# Patient Record
Sex: Female | Born: 1970 | Race: White | Hispanic: No | Marital: Married | Smoking: Never smoker
Health system: Southern US, Community
[De-identification: ages and names within clinical notes are randomized; demographics above are authoritative.]

## PROBLEM LIST (undated history)

## (undated) DIAGNOSIS — R011 Cardiac murmur, unspecified: Secondary | ICD-10-CM

## (undated) DIAGNOSIS — I10 Essential (primary) hypertension: Secondary | ICD-10-CM

## (undated) DIAGNOSIS — J45909 Unspecified asthma, uncomplicated: Secondary | ICD-10-CM

## (undated) HISTORY — PX: TUBAL LIGATION: SHX77

---

## 2012-02-12 ENCOUNTER — Ambulatory Visit: Payer: Self-pay | Admitting: Internal Medicine

## 2013-12-08 ENCOUNTER — Ambulatory Visit: Payer: Self-pay | Admitting: Nurse Practitioner

## 2015-03-23 ENCOUNTER — Other Ambulatory Visit: Payer: Self-pay

## 2015-03-23 DIAGNOSIS — E559 Vitamin D deficiency, unspecified: Secondary | ICD-10-CM

## 2015-03-23 DIAGNOSIS — E785 Hyperlipidemia, unspecified: Secondary | ICD-10-CM

## 2015-03-23 DIAGNOSIS — R5382 Chronic fatigue, unspecified: Secondary | ICD-10-CM

## 2015-03-23 DIAGNOSIS — I10 Essential (primary) hypertension: Secondary | ICD-10-CM

## 2015-03-23 NOTE — Progress Notes (Signed)
Patient came in to have blood drawn per Imelda Pillowhelsa Holland, NP-C at [facility].  Blood was drawn from the right arm without any incident. [facility].

## 2015-03-24 LAB — CMP12+LP+TP+TSH+6AC+CBC/D/PLT
A/G RATIO: 1.5 (ref 1.1–2.5)
ALBUMIN: 3.9 g/dL (ref 3.5–5.5)
ALT: 12 IU/L (ref 0–32)
AST: 14 IU/L (ref 0–40)
Alkaline Phosphatase: 60 IU/L (ref 39–117)
BASOS ABS: 0 10*3/uL (ref 0.0–0.2)
BILIRUBIN TOTAL: 0.4 mg/dL (ref 0.0–1.2)
BUN/Creatinine Ratio: 16 (ref 9–23)
BUN: 13 mg/dL (ref 6–24)
Basos: 0 %
CHOLESTEROL TOTAL: 197 mg/dL (ref 100–199)
CREATININE: 0.8 mg/dL (ref 0.57–1.00)
Calcium: 8.8 mg/dL (ref 8.7–10.2)
Chloride: 103 mmol/L (ref 97–106)
Chol/HDL Ratio: 6.2 ratio units — ABNORMAL HIGH (ref 0.0–4.4)
EOS (ABSOLUTE): 0.3 10*3/uL (ref 0.0–0.4)
Eos: 5 %
Estimated CHD Risk: 1.7 times avg. — ABNORMAL HIGH (ref 0.0–1.0)
FREE THYROXINE INDEX: 2 (ref 1.2–4.9)
GFR, EST AFRICAN AMERICAN: 103 mL/min/{1.73_m2} (ref >59)
GFR, EST NON AFRICAN AMERICAN: 89 mL/min/{1.73_m2} (ref >59)
GGT: 19 IU/L (ref 0–60)
GLOBULIN, TOTAL: 2.6 g/dL (ref 1.5–4.5)
Glucose: 102 mg/dL — ABNORMAL HIGH (ref 65–99)
HDL: 32 mg/dL — AB (ref >39)
Hematocrit: 41.1 % (ref 34.0–46.6)
Hemoglobin: 14 g/dL (ref 11.1–15.9)
IRON: 70 ug/dL (ref 27–159)
Immature Grans (Abs): 0 10*3/uL (ref 0.0–0.1)
Immature Granulocytes: 0 %
LDH: 197 IU/L (ref 119–226)
LDL Calculated: 124 mg/dL — ABNORMAL HIGH (ref 0–99)
LYMPHS ABS: 2.1 10*3/uL (ref 0.7–3.1)
Lymphs: 30 %
MCH: 29.5 pg (ref 26.6–33.0)
MCHC: 34.1 g/dL (ref 31.5–35.7)
MCV: 87 fL (ref 79–97)
MONOCYTES: 8 %
Monocytes Absolute: 0.6 10*3/uL (ref 0.1–0.9)
NEUTROS PCT: 57 %
Neutrophils Absolute: 3.8 10*3/uL (ref 1.4–7.0)
PHOSPHORUS: 3.1 mg/dL (ref 2.5–4.5)
PLATELETS: 357 10*3/uL (ref 150–379)
Potassium: 4 mmol/L (ref 3.5–5.2)
RBC: 4.74 x10E6/uL (ref 3.77–5.28)
RDW: 13.2 % (ref 12.3–15.4)
Sodium: 142 mmol/L (ref 136–144)
T3 UPTAKE RATIO: 29 % (ref 24–39)
T4 TOTAL: 6.8 ug/dL (ref 4.5–12.0)
TOTAL PROTEIN: 6.5 g/dL (ref 6.0–8.5)
TRIGLYCERIDES: 204 mg/dL — AB (ref 0–149)
TSH: 1.73 u[IU]/mL (ref 0.450–4.500)
URIC ACID: 5.3 mg/dL (ref 2.5–7.1)
VLDL Cholesterol Cal: 41 mg/dL — ABNORMAL HIGH (ref 5–40)
WBC: 6.8 10*3/uL (ref 3.4–10.8)

## 2015-03-24 LAB — VITAMIN D 25 HYDROXY (VIT D DEFICIENCY, FRACTURES): VIT D 25 HYDROXY: 15 ng/mL — AB (ref 30.0–100.0)

## 2015-04-17 ENCOUNTER — Encounter: Payer: Self-pay | Admitting: Physician Assistant

## 2015-04-17 ENCOUNTER — Ambulatory Visit: Payer: Self-pay | Admitting: Physician Assistant

## 2015-04-17 VITALS — BP 160/110 | HR 79 | Temp 98.4°F

## 2015-04-17 DIAGNOSIS — J018 Other acute sinusitis: Secondary | ICD-10-CM

## 2015-04-17 MED ORDER — FLUTICASONE PROPIONATE 50 MCG/ACT NA SUSP: 2.0000 | Freq: Every day | NASAL | Status: DC

## 2015-04-17 MED ORDER — AZITHROMYCIN 250 MG PO TABS: ORAL_TABLET | ORAL | Status: DC

## 2015-04-17 NOTE — Progress Notes (Signed)
S: C/o runny nose and congestion for 1.5 weeks, no fever, chills, cp/sob, v/d; mucus is green and thick, cough is sporadic, c/o of facial and dental pain.  Voice is hoarse, used otc sinus med that has a decongestant in it, hasn't been taking her bp meds as it makes her feel bad  O: PE: vitals wnl excpet bp elevated at 160/100,  nad,  perrl eomi, normocephalic, tms dull, nasal mucosa red and swollen, throat injected, neck supple no lymph, lungs c t a, cv rrr, neuro intact  A:  Acute sinusitis, htn, noncompliant with meds  P: zpack, flonase, stop using otc meds, drink fluids, continue regular meds , return if not improving in 5 days, return earlier if worsening , see pcp tomorrow for already scheduled appointment for bp issues

## 2015-07-02 ENCOUNTER — Other Ambulatory Visit: Payer: Self-pay

## 2015-07-02 DIAGNOSIS — Z299 Encounter for prophylactic measures, unspecified: Secondary | ICD-10-CM

## 2015-07-02 NOTE — Progress Notes (Signed)
Patient came in to have blood drawn per Dr. Christy SartoriusShaukat Khan's orders.  Blood was drawn from the left arm without any incident.

## 2015-07-04 LAB — COMPREHENSIVE METABOLIC PANEL
A/G RATIO: 1.8 (ref 1.2–2.2)
ALBUMIN: 4.1 g/dL (ref 3.5–5.5)
ALT: 20 IU/L (ref 0–32)
AST: 14 IU/L (ref 0–40)
Alkaline Phosphatase: 53 IU/L (ref 39–117)
BUN / CREAT RATIO: 19 (ref 9–23)
BUN: 16 mg/dL (ref 6–24)
Bilirubin Total: 0.4 mg/dL (ref 0.0–1.2)
CALCIUM: 9.1 mg/dL (ref 8.7–10.2)
CO2: 20 mmol/L (ref 18–29)
Chloride: 102 mmol/L (ref 96–106)
Creatinine, Ser: 0.84 mg/dL (ref 0.57–1.00)
GFR, EST AFRICAN AMERICAN: 97 mL/min/{1.73_m2} (ref >59)
GFR, EST NON AFRICAN AMERICAN: 84 mL/min/{1.73_m2} (ref >59)
GLOBULIN, TOTAL: 2.3 g/dL (ref 1.5–4.5)
Glucose: 107 mg/dL — ABNORMAL HIGH (ref 65–99)
POTASSIUM: 4.1 mmol/L (ref 3.5–5.2)
Sodium: 143 mmol/L (ref 134–144)
TOTAL PROTEIN: 6.4 g/dL (ref 6.0–8.5)

## 2015-07-04 LAB — CK ISOENZYMES
CK MB: 0 % (ref 0–3)
CK-BB: 0 %
CK-MM: 100 % (ref 97–100)
MACRO TYPE 2: 0 %
Macro Type 1: 0 %
Total CK: 101 U/L (ref 24–173)

## 2015-07-04 LAB — SPECIMEN STATUS

## 2015-07-04 LAB — MAGNESIUM: Magnesium: 2 mg/dL (ref 1.6–2.3)

## 2015-07-06 ENCOUNTER — Encounter: Payer: Self-pay | Admitting: Emergency Medicine

## 2015-07-06 NOTE — Progress Notes (Signed)
Lab results were faxed to Dr. Adrian BlackwaterShaukat Khan at [facility] per patient's request.

## 2015-07-07 ENCOUNTER — Encounter: Payer: Self-pay | Admitting: Emergency Medicine

## 2015-07-07 ENCOUNTER — Emergency Department: Payer: Managed Care, Other (non HMO)

## 2015-07-07 ENCOUNTER — Emergency Department
Admission: EM | Admit: 2015-07-07 | Discharge: 2015-07-07 | Disposition: A | Discharge status: Home / Self Care | Payer: Managed Care, Other (non HMO) | Attending: Emergency Medicine | Admitting: Emergency Medicine

## 2015-07-07 DIAGNOSIS — Y999 Unspecified external cause status: Secondary | ICD-10-CM | POA: Insufficient documentation

## 2015-07-07 DIAGNOSIS — S022XXA Fracture of nasal bones, initial encounter for closed fracture: Secondary | ICD-10-CM | POA: Diagnosis not present

## 2015-07-07 DIAGNOSIS — Y9389 Activity, other specified: Secondary | ICD-10-CM | POA: Insufficient documentation

## 2015-07-07 DIAGNOSIS — J45909 Unspecified asthma, uncomplicated: Secondary | ICD-10-CM | POA: Insufficient documentation

## 2015-07-07 DIAGNOSIS — S60041A Contusion of right ring finger without damage to nail, initial encounter: Secondary | ICD-10-CM | POA: Diagnosis not present

## 2015-07-07 DIAGNOSIS — Y929 Unspecified place or not applicable: Secondary | ICD-10-CM | POA: Insufficient documentation

## 2015-07-07 DIAGNOSIS — S6000XA Contusion of unspecified finger without damage to nail, initial encounter: Secondary | ICD-10-CM

## 2015-07-07 DIAGNOSIS — I10 Essential (primary) hypertension: Secondary | ICD-10-CM | POA: Insufficient documentation

## 2015-07-07 DIAGNOSIS — W138XXA Fall from, out of or through other building or structure, initial encounter: Secondary | ICD-10-CM | POA: Insufficient documentation

## 2015-07-07 DIAGNOSIS — Z79899 Other long term (current) drug therapy: Secondary | ICD-10-CM | POA: Diagnosis not present

## 2015-07-07 DIAGNOSIS — S60221A Contusion of right hand, initial encounter: Secondary | ICD-10-CM | POA: Insufficient documentation

## 2015-07-07 DIAGNOSIS — S0033XA Contusion of nose, initial encounter: Secondary | ICD-10-CM

## 2015-07-07 DIAGNOSIS — S0990XA Unspecified injury of head, initial encounter: Secondary | ICD-10-CM

## 2015-07-07 DIAGNOSIS — S60021A Contusion of right index finger without damage to nail, initial encounter: Secondary | ICD-10-CM | POA: Diagnosis not present

## 2015-07-07 DIAGNOSIS — S60031A Contusion of right middle finger without damage to nail, initial encounter: Secondary | ICD-10-CM | POA: Diagnosis not present

## 2015-07-07 HISTORY — DX: Unspecified asthma, uncomplicated: J45.909

## 2015-07-07 HISTORY — DX: Essential (primary) hypertension: I10

## 2015-07-07 MED ORDER — HYDROCODONE-ACETAMINOPHEN 5-325 MG PO TABS: 1.0000 | ORAL_TABLET | Freq: Four times a day (QID) | ORAL | Status: DC | PRN

## 2015-07-07 NOTE — ED Notes (Signed)
Was walking down steps, step broke, caused pt to fall, landing on her face yesterday. Pt states her nose bled from both nares. Today, bilateral eye bruising. Also c/o right wrist and finger pain as well.

## 2015-07-07 NOTE — Discharge Instructions (Signed)
Facial or Scalp Contusion  A facial or scalp contusion is a deep bruise on the face or head. Contusions happen when an injury causes bleeding under the skin. Signs of bruising include pain, puffiness (swelling), and discolored skin. The contusion may turn blue, purple, or yellow. HOME CARE  Only take medicines as told by your doctor.  Put ice on the injured area.  Put ice in a plastic bag.  Place a towel between your skin and the bag.  Leave the ice on for 20 minutes, 2-3 times a day. GET HELP IF:  You have bite problems.  You have pain when chewing.  You are worried about your face not healing normally. GET HELP RIGHT AWAY IF:   You have severe pain or a headache and medicine does not help.  You are very tired or confused, or your personality changes.  You throw up (vomit).  You have a nosebleed that will not stop.  You see two of everything (double vision) or have blurry vision.  You have fluid coming from your nose or ear.  You have problems walking or using your arms or legs. MAKE SURE YOU:   Understand these instructions.  Will watch your condition.  Will get help right away if you are not doing well or get worse.   This information is not intended to replace advice given to you by your health care provider. Make sure you discuss any questions you have with your health care provider.   Document Released: 03/20/2011 Document Revised: 04/21/2014 Document Reviewed: 11/11/2012 Elsevier Interactive Patient Education 2016 Elsevier Inc[employer]  Cryotherapy Cryotherapy is when you put ice on your injury. Ice helps lessen pain and puffiness (swelling) after an injury. Ice works the best when you start using it in the first 24 to 48 hours after an injury. HOME CARE  Put a dry or damp towel between the ice pack and your skin.  You may press gently on the ice pack.  Leave the ice on for no more than 10 to 20 minutes at a time.  Check your skin after 5 minutes to make sure  your skin is okay.  Rest at least 20 minutes between ice pack uses.  Stop using ice when your skin loses feeling (numbness).  Do not use ice on someone who cannot tell you when it hurts. This includes small children and people with memory problems (dementia). GET HELP RIGHT AWAY IF:  You have white spots on your skin.  Your skin turns blue or pale.  Your skin feels waxy or hard.  Your puffiness gets worse. MAKE SURE YOU:   Understand these instructions.  Will watch your condition.  Will get help right away if you are not doing well or get worse.   This information is not intended to replace advice given to you by your health care provider. Make sure you discuss any questions you have with your health care provider.   Document Released: 09/17/2007 Document Revised: 06/23/2011 Document Reviewed: 11/21/2010 Elsevier Interactive Patient Education 2016 Elsevier Inc.  Nasal Fracture A nasal fracture is a break or crack in the bones or cartilage of the nose. Minor breaks do not require treatment. These breaks usually heal on their own after about one month. Serious breaks may require surgery. CAUSES This injury is usually caused by a blunt injury to the nose. This type of injury often occurs from:  Contact sports.  Car accidents.  Falls.  Getting punched. SYMPTOMS Symptoms of this injury include:  Pain.  Swelling of the nose.  Bleeding from the nose.  Bruising around the nose or eyes. This may include having black eyes.  Crooked appearance of the nose. DIAGNOSIS This injury may be diagnosed with a physical exam. The health care provider will gently feel the nose for signs of broken bones. He or she will look inside the nostrils to make sure that there is not a blood-filled swelling on the dividing wall between the nostrils (septal hematoma). X-rays of the nose may not show a nasal fracture even when one is present. In some cases, X-rays or a CT scan may be done 1-5  days after the injury. Sometimes, the health care provider will want to wait until the swelling has gone down. TREATMENT Often, minor fractures that have caused no deformity do not require treatment. More serious fractures in which bones have moved out of position may require surgery, which will take place after the swelling is gone. Surgery will stabilize and align the fracture. In some cases, a health care provider may be able to reposition the bones without surgery. This may be done in the health care provider's office after medicine is given to numb the area (local anesthetic). HOME CARE INSTRUCTIONS  If directed, apply ice to the injured area:  Put ice in a plastic bag.  Place a towel between your skin and the bag.  Leave the ice on for 20 minutes, 2-3 times per day.  Take over-the-counter and prescription medicines only as told by your health care provider.  If your nose starts to bleed, sit in an upright position while you squeeze the soft parts of your nose against the dividing wall between your nostrils (septum) for 10 minutes.  Try to avoid blowing your nose.  Return to your normal activities as told by your health care provider. Ask your health care provider what activities are safe for you.  Avoid contact sports for 3-4 weeks or as told by your health care provider.  Keep all follow-up visits as told by your health care provider. This is important. SEEK MEDICAL CARE IF:  Your pain increases or becomes severe.  You continue to have nosebleeds.  The shape of your nose does not return to normal within 5 days.  You have pus draining out of your nose. SEEK IMMEDIATE MEDICAL CARE IF:  You have bleeding from your nose that does not stop after you pinch your nostrils closed for 20 minutes and keep ice on your nose.  You have clear fluid draining out of your nose.  You notice a grape-like swelling on the septum. This swelling is a collection of blood (hematoma) that must be  drained to help prevent infection.  You have difficulty moving your eyes.  You have repeated vomiting.   This information is not intended to replace advice given to you by your health care provider. Make sure you discuss any questions you have with your health care provider.   Document Released: 03/28/2000 Document Revised: 12/20/2014 Document Reviewed: 05/08/2014 Elsevier Interactive Patient Education 2016 Elsevier Inc.  Head Injury, Adult You have a head injury. Headaches and throwing up (vomiting) are common after a head injury. It should be easy to wake up from sleeping. Sometimes you must stay in the hospital. Most problems happen within the first 24 hours. Side effects may occur up to 7-10 days after the injury.  WHAT ARE THE TYPES OF HEAD INJURIES? Head injuries can be as minor as a bump. Some head injuries can be  more severe. More severe head injuries include:  A jarring injury to the brain (concussion).  A bruise of the brain (contusion). This mean there is bleeding in the brain that can cause swelling.  A cracked skull (skull fracture).  Bleeding in the brain that collects, clots, and forms a bump (hematoma). WHEN SHOULD I GET HELP RIGHT AWAY?   You are confused or sleepy.  You cannot be woken up.  You feel sick to your stomach (nauseous) or keep throwing up (vomiting).  Your dizziness or unsteadiness is getting worse.  You have very bad, lasting headaches that are not helped by medicine. Take medicines only as told by your doctor.  You cannot use your arms or legs like normal.  You cannot walk.  You notice changes in the black spots in the center of the colored part of your eye (pupil).  You have clear or bloody fluid coming from your nose or ears.  You have trouble seeing. During the next 24 hours after the injury, you must stay with someone who can watch you. This person should get help right away (call 911 in the [place] if you start to shake and are not able  to control it (have seizures), you pass out, or you are unable to wake up. HOW CAN I PREVENT A HEAD INJURY IN THE FUTURE?  Wear seat belts.  Wear a helmet while bike riding and playing sports like football.  Stay away from dangerous activities around the house. WHEN CAN I RETURN TO NORMAL ACTIVITIES AND ATHLETICS? See your doctor before doing these activities. You should not do normal activities or play contact sports until 1 week after the following symptoms have stopped:  Headache that does not go away.  Dizziness.  Poor attention.  Confusion.  Memory problems.  Sickness to your stomach or throwing up.  Tiredness.  Fussiness.  Bothered by bright lights or loud noises.  Anxiousness or depression.  Restless sleep. MAKE SURE YOU:   Understand these instructions.  Will watch your condition.  Will get help right away if you are not doing well or get worse.   This information is not intended to replace advice given to you by your health care provider. Make sure you discuss any questions you have with your health care provider.   Document Released: 03/13/2008 Document Revised: 04/21/2014 Document Reviewed: 12/06/2012 Elsevier Interactive Patient Education 2016 Elsevier Inc.  Hand Contusion A hand contusion is a deep bruise on your hand area. Contusions are the result of an injury that caused bleeding under the skin. The contusion may turn blue, purple, or yellow. Minor injuries will give you a painless contusion, but more severe contusions may stay painful and swollen for a few weeks. CAUSES  A contusion is usually caused by a blow, trauma, or direct force to an area of the body. SYMPTOMS   Swelling and redness of the injured area.  Discoloration of the injured area.  Tenderness and soreness of the injured area.  Pain. DIAGNOSIS  The diagnosis can be made by taking a history and performing a physical exam. An X-ray, CT scan, or MRI may be needed to determine if  there were any associated injuries, such as broken bones (fractures). TREATMENT  Often, the best treatment for a hand contusion is resting, elevating, icing, and applying cold compresses to the injured area. Over-the-counter medicines may also be recommended for pain control. HOME CARE INSTRUCTIONS   Put ice on the injured area.  Put ice in a plastic  bag.  Place a towel between your skin and the bag.  Leave the ice on for 15-20 minutes, 03-04 times a day.  Only take over-the-counter or prescription medicines as directed by your caregiver. Your caregiver may recommend avoiding anti-inflammatory medicines (aspirin, ibuprofen, and naproxen) for 48 hours because these medicines may increase bruising.  If told, use an elastic wrap as directed. This can help reduce swelling. You may remove the wrap for sleeping, showering, and bathing. If your fingers become numb, cold, or blue, take the wrap off and reapply it more loosely.  Elevate your hand with pillows to reduce swelling.  Avoid overusing your hand if it is painful. SEEK IMMEDIATE MEDICAL CARE IF:   You have increased redness, swelling, or pain in your hand.  Your swelling or pain is not relieved with medicines.  You have loss of feeling in your hand or are unable to move your fingers.  Your hand turns cold or blue.  You have pain when you move your fingers.  Your hand becomes warm to the touch.  Your contusion does not improve in 2 days. MAKE SURE YOU:   Understand these instructions.  Will watch your condition.  Will get help right away if you are not doing well or get worse.   This information is not intended to replace advice given to you by your health care provider. Make sure you discuss any questions you have with your health care provider.   Document Released: 09/20/2001 Document Revised: 12/24/2011 Document Reviewed: 09/22/2011 Elsevier Interactive Patient Education [employer]

## 2015-07-07 NOTE — ED Provider Notes (Signed)
[facility] Emergency Department Provider Note  ____________________________________________  Time seen: Approximately 11:08 AM  I have reviewed the triage vital signs and the nursing notes.   HISTORY  Chief Complaint Fall    HPI Holly Todd is a 45 y.o. female , NAD, presents to the emergency department after a fall yesterday. States a step broke causing her to fall forward. States she hit about her mouth and nose causing bilateral nose bleed. Denies LOC, dizziness, headaches. Was seen in her dentist office for xrays of her teeth which were negative. States she continues with right hand and finger pain and is concerned about a fractured nose. Pain about her fingers increases when she tries to make a fist. Denies any numbness, weakness, tingling. Has had some minor lower back pain that has been off and on. Also noticed bruising around her eyes has increased over the last 24 hours but had decrease in swelling about her face. Has had no active nosebleeds today but notes she can "taste blood" still this morning. Has had no changes in speech or gait. No changes in vision, chest pain, neck pain, abdominal pain, nausea, vomiting.    Past Medical History  Diagnosis Date  . Hypertension   . Asthma     There are no active problems to display for this patient.   Past Surgical History  Procedure Laterality Date  . Cesarean section    . Tubal ligation      Current Outpatient Rx  Name  Route  Sig  Dispense  Refill  . albuterol (PROVENTIL HFA;VENTOLIN HFA) 108 (90 Base) MCG/ACT inhaler   Inhalation   Inhale into the lungs every 6 (six) hours as needed for wheezing or shortness of breath.         Marland Kitchen azithromycin (ZITHROMAX Z-PAK) 250 MG tablet      2 pills today then 1 pill a day for 4 days   6 each   0   . fluticasone (FLONASE) 50 MCG/ACT nasal spray   Each Nare   Place 2 sprays into both nostrils daily.   16 g   6   .  HYDROcodone-acetaminophen (NORCO) 5-325 MG tablet   Oral   Take 1 tablet by mouth every 6 (six) hours as needed for severe pain.   10 tablet   0     Allergies Norvasc  No family history on file.  Social History Social History  Substance Use Topics  . Smoking status: Never Smoker   . Smokeless tobacco: None  . Alcohol Use: No     Review of Systems  Constitutional: No fever/chills, fatigue.  Eyes: Positive bilateral swelling about the eyes with bruising. No visual changes. No discharge, redness ENT: Positive nosebleeds that have resolved. No sore throat, teeth pain, mouth pain. Cardiovascular: No chest pain, palpitations. Respiratory: No cough. No shortness of breath. No wheezing.  Gastrointestinal: No abdominal pain.  No nausea, vomiting.  Musculoskeletal: Positive right hand and finger pain. Positive nasal pain. Negative for back, neck pain.  Skin: Positive bruising about the eyes, nose. Negative for rash, lacerations. Neurological: Negative for headaches, focal weakness or numbness. No dizziness, LOC. 10-point ROS otherwise negative.  ____________________________________________   PHYSICAL EXAM:  VITAL SIGNS: ED Triage Vitals  Enc Vitals Group     BP 07/07/15 1100 150/86 mmHg     Pulse Rate 07/07/15 1059 96     Resp 07/07/15 1059 18     Temp 07/07/15 1059 97.9 F (36.6 C)  Temp Source 07/07/15 1059 Oral     SpO2 07/07/15 1059 98 %     Weight 07/07/15 1059 230 lb (104.327 kg)     Height 07/07/15 1059 5\' 6"  (1.676 m)     Head Cir --      Peak Flow --      Pain Score 07/07/15 1059 5     Pain Loc --      Pain Edu? --      Excl. in GC? --     Constitutional: Alert and oriented. Well appearing and in no acute distress. Eyes: Conjunctivae are normal. PERRL. EOMI without pain. Bruising below bilateral eyes. Head: Atraumatic. ENT:      Ears: No discharge noted.      Nose: No congestion/rhinnorhea. Tenderness to palpation about the bridge of the nose. Diffuse  bruising and swelling about the nose. No open wounds or lesions.       Mouth/Throat: Mucous membranes are moist.  Neck: No stridor. No cervical spine tenderness to palpation. Supple with full range of motion. Hematological/Lymphatic/Immunilogical: No cervical lymphadenopathy. Cardiovascular: Normal rate, regular rhythm. Normal S1 and S2.  Good peripheral circulation. Respiratory: Normal respiratory effort without tachypnea or retractions. Lungs CTAB with breath sounds noted throughout all fields. Musculoskeletal: Mild tenderness to palpation about the distal right hand and fingers #2, 3, 4. No overt swelling. No edema noted. Full range of motion of right wrist, hand, fingers but with some discomfort with bending of fingers #2, 3, 4. Neurologic:  Normal speech and language. No gross focal neurologic deficits are appreciated. Sensation grossly intact of the right upper extremity. Skin:  Skin is warm, dry and intact. No rash noted. Psychiatric: Mood and affect are normal. Speech and behavior are normal. Patient exhibits appropriate insight and judgement.   ____________________________________________   LABS  None  ____________________________________________  EKG  None ____________________________________________  RADIOLOGY I have personally viewed and evaluated these images (plain radiographs) as part of my medical decision making, as well as reviewing the written report by the radiologist.  Dg Hand Complete Right  07/07/2015  CLINICAL DATA:  45 year old female with right hand swelling and pain at the base of the thumb and along the 30 and fourth digits. Patient fell down the stairs yesterday. EXAM: RIGHT HAND - COMPLETE 3+ VIEW COMPARISON:  None. FINDINGS: There is no evidence of fracture or dislocation. There is no evidence of arthropathy or other focal bone abnormality. Soft tissues are unremarkable. IMPRESSION: Negative. Electronically Signed   By: Malachy MoanHeath  McCullough M.D.   On:  07/07/2015 11:34   Ct Maxillofacial Wo Cm  07/07/2015  CLINICAL DATA:  Patient fell yesterday while walking down steps. Nose bleed. EXAM: CT MAXILLOFACIAL WITHOUT CONTRAST TECHNIQUE: Multidetector CT imaging of the maxillofacial structures was performed. Multiplanar CT image reconstructions were also generated. A small metallic BB was placed on the right temple in order to reliably differentiate right from left. COMPARISON:  None. FINDINGS: Comminuted RIGHT and LEFT nasal bone fractures. No other facial fractures are seen. TMJs are located. Soft tissue swelling about the nose. Nasal septal deviation LEFT-to-RIGHT 3-4 mm. Negative orbits. Visualized intracranial compartment unremarkable. Calvarium intact. No sinus or mastoid air fluid level. No blowout injury. IMPRESSION: Nasal bone fractures. No other facial fracture is evident. No blowout injury. Electronically Signed   By: Elsie StainJohn T Curnes M.D.   On: 07/07/2015 12:08    ____________________________________________    PROCEDURES  Procedure(s) performed: None      Medications - No data to display  ____________________________________________   INITIAL IMPRESSION / ASSESSMENT AND PLAN / ED COURSE  Pertinent imaging results that were available during my care of the patient were reviewed by me and considered in my medical decision making (see chart for details).  Patient's diagnosis is consistent with nasal fracture, contusion to nose, contusion to right hand and head injury after a fall. Patient will be discharged home with prescriptions for Norco to take as needed for severe pain. May continue to take Tylenol and ibuprofen as needed. Apply ice to the nose and right hand 20 minutes 3-4 times daily. Patient is to follow up with Dr. Andee Poles in ENT to recheck and further evaluate nasal fracture.  Patient is given ED precautions to return to the ED for any worsening or new symptoms.    ____________________________________________  FINAL  CLINICAL IMPRESSION(S) / ED DIAGNOSES  Final diagnoses:  Nasal fracture, closed, initial encounter  Contusion, nose, initial encounter  Contusion of right hand including fingers, initial encounter  Head injury, initial encounter      NEW MEDICATIONS STARTED DURING THIS VISIT:  New Prescriptions   HYDROCODONE-ACETAMINOPHEN (NORCO) 5-325 MG TABLET    Take 1 tablet by mouth every 6 (six) hours as needed for severe pain.         Hope Pigeon, PA-C 07/07/15 1230  Jeanmarie Plant, MD 07/07/15 [phone]

## 2015-07-16 ENCOUNTER — Encounter
Admission: RE | Admit: 2015-07-16 | Discharge: 2015-07-16 | Disposition: A | Discharge status: Home / Self Care | Payer: Managed Care, Other (non HMO) | Source: Ambulatory Visit | Attending: Otolaryngology | Admitting: Otolaryngology

## 2015-07-16 DIAGNOSIS — S022XXA Fracture of nasal bones, initial encounter for closed fracture: Secondary | ICD-10-CM | POA: Diagnosis not present

## 2015-07-16 HISTORY — DX: Cardiac murmur, unspecified: R01.1

## 2015-07-16 NOTE — Patient Instructions (Signed)
  Your procedure is scheduled on:07/20/15 Report to Day Surgery. To find out your arrival time please call [phone] between 1PM - 3PM on 07/19/15  Remember: Instructions that are not followed completely may result in serious medical risk, up to and including death, or upon the discretion of your surgeon and anesthesiologist your surgery may need to be rescheduled.    __x__ 1. Do not eat food or drink liquids after midnight. No gum chewing or hard candies.     __x__ 2. No Alcohol for 24 hours before or after surgery.   ____ 3. Bring all medications with you on the day of surgery if instructed.    __x__ 4. Notify your doctor if there is any change in your medical condition     (cold, fever, infections).     Do not wear jewelry, make-up, hairpins, clips or nail polish.  Do not wear lotions, powders, or perfumes. You may wear deodorant.  Do not shave 48 hours prior to surgery. Men may shave face and neck.  Do not bring valuables to the hospital.    [facility] is not responsible for any belongings or valuables.               Contacts, dentures or bridgework may not be worn into surgery.  Leave your suitcase in the car. After surgery it may be brought to your room.  For patients admitted to the hospital, discharge time is determined by your                treatment team.   Patients discharged the day of surgery will not be allowed to drive home.   Please read over the following fact sheets that you were given:   Surgical Site Infection Prevention   ____ Take these medicines the morning of surgery with A SIP OF WATER:    1. lisinopril  2. labetalol  3. Bring HCTZ  4.  5.  6.  ____ Fleet Enema (as directed)   ____ Use CHG Soap as directed  _x___ Use inhalers on the day of surgery  ____ Stop metformin 2 days prior to surgery    ____ Take 1/2 of usual insulin dose the night before surgery and none on the morning of surgery.   ____ Stop Coumadin/Plavix/aspirin on   __x__  Stop Anti-inflammatories on tylenol only    ____ Stop supplements until after surgery.    ____ Bring C-Pap to the hospital.

## 2015-07-16 NOTE — Pre-Procedure Instructions (Signed)
Called Dr.Vaught's office to verify their request for Ekg and other tests from [facility]. Awaiting results

## 2015-07-18 NOTE — Pre-Procedure Instructions (Signed)
Clearance on chart Dr Adrian BlackwaterShaukat Khan

## 2015-07-20 ENCOUNTER — Encounter
Admission: RE | Disposition: A | Discharge status: Home / Self Care | Payer: Self-pay | Source: Ambulatory Visit | Attending: Otolaryngology

## 2015-07-20 ENCOUNTER — Ambulatory Visit: Payer: Managed Care, Other (non HMO) | Admitting: Registered Nurse

## 2015-07-20 ENCOUNTER — Ambulatory Visit
Admission: RE | Admit: 2015-07-20 | Discharge: 2015-07-20 | Disposition: A | Discharge status: Home / Self Care | Payer: Managed Care, Other (non HMO) | Source: Ambulatory Visit | Attending: Otolaryngology | Admitting: Otolaryngology

## 2015-07-20 ENCOUNTER — Encounter: Payer: Self-pay | Admitting: *Deleted

## 2015-07-20 DIAGNOSIS — W19XXXA Unspecified fall, initial encounter: Secondary | ICD-10-CM | POA: Diagnosis not present

## 2015-07-20 DIAGNOSIS — J45909 Unspecified asthma, uncomplicated: Secondary | ICD-10-CM | POA: Insufficient documentation

## 2015-07-20 DIAGNOSIS — M950 Acquired deformity of nose: Secondary | ICD-10-CM | POA: Insufficient documentation

## 2015-07-20 DIAGNOSIS — I10 Essential (primary) hypertension: Secondary | ICD-10-CM | POA: Insufficient documentation

## 2015-07-20 DIAGNOSIS — Z8249 Family history of ischemic heart disease and other diseases of the circulatory system: Secondary | ICD-10-CM | POA: Diagnosis not present

## 2015-07-20 DIAGNOSIS — Z79899 Other long term (current) drug therapy: Secondary | ICD-10-CM | POA: Insufficient documentation

## 2015-07-20 DIAGNOSIS — S022XXA Fracture of nasal bones, initial encounter for closed fracture: Secondary | ICD-10-CM | POA: Insufficient documentation

## 2015-07-20 HISTORY — PX: CLOSED REDUCTION NASAL FRACTURE: SHX5365

## 2015-07-20 LAB — POCT PREGNANCY, URINE: Preg Test, Ur: NEGATIVE

## 2015-07-20 SURGERY — CLOSED REDUCTION, FRACTURE, NASAL BONE
Anesthesia: General | Site: Nose

## 2015-07-20 MED ORDER — OXYMETAZOLINE HCL 0.05 % NA SOLN
NASAL | Status: AC
  Filled 2015-07-20: qty 15

## 2015-07-20 MED ORDER — SUCCINYLCHOLINE CHLORIDE 20 MG/ML IJ SOLN
INTRAMUSCULAR | Status: DC | PRN
  Administered 2015-07-20: 100 mg via INTRAVENOUS

## 2015-07-20 MED ORDER — ONDANSETRON HCL 4 MG/2ML IJ SOLN: 4.0000 mg | Freq: Once | INTRAMUSCULAR | Status: DC | PRN

## 2015-07-20 MED ORDER — MIDAZOLAM HCL 2 MG/2ML IJ SOLN
INTRAMUSCULAR | Status: DC | PRN
  Administered 2015-07-20: 2 mg via INTRAVENOUS

## 2015-07-20 MED ORDER — PHENYLEPHRINE HCL 0.5 % NA SOLN
NASAL | Status: DC | PRN
  Administered 2015-07-20: 2 [drp] via NASAL

## 2015-07-20 MED ORDER — LIDOCAINE HCL (CARDIAC) 20 MG/ML IV SOLN
INTRAVENOUS | Status: DC | PRN
  Administered 2015-07-20: 100 mg via INTRAVENOUS

## 2015-07-20 MED ORDER — HYDROCODONE-ACETAMINOPHEN 5-325 MG PO TABS: 1.0000 | ORAL_TABLET | ORAL | Status: DC | PRN

## 2015-07-20 MED ORDER — ONDANSETRON HCL 4 MG/2ML IJ SOLN
INTRAMUSCULAR | Status: DC | PRN
  Administered 2015-07-20: 4 mg via INTRAVENOUS

## 2015-07-20 MED ORDER — DEXAMETHASONE SODIUM PHOSPHATE 10 MG/ML IJ SOLN
INTRAMUSCULAR | Status: DC | PRN
  Administered 2015-07-20: 10 mg via INTRAVENOUS

## 2015-07-20 MED ORDER — FAMOTIDINE 20 MG PO TABS
ORAL_TABLET | ORAL | Status: AC
  Filled 2015-07-20: qty 1

## 2015-07-20 MED ORDER — LACTATED RINGERS IV SOLN
INTRAVENOUS | Status: DC
  Administered 2015-07-20: 06:00:00 via INTRAVENOUS

## 2015-07-20 MED ORDER — FENTANYL CITRATE (PF) 100 MCG/2ML IJ SOLN: 25.0000 ug | INTRAMUSCULAR | Status: DC | PRN

## 2015-07-20 MED ORDER — FENTANYL CITRATE (PF) 100 MCG/2ML IJ SOLN
INTRAMUSCULAR | Status: DC | PRN
  Administered 2015-07-20: 50 ug via INTRAVENOUS

## 2015-07-20 MED ORDER — PROPOFOL 10 MG/ML IV BOLUS
INTRAVENOUS | Status: DC | PRN
  Administered 2015-07-20: 180 mg via INTRAVENOUS

## 2015-07-20 MED ORDER — FAMOTIDINE 20 MG PO TABS
20.0000 mg | ORAL_TABLET | Freq: Once | ORAL | Status: AC
  Administered 2015-07-20: 20 mg via ORAL

## 2015-07-20 MED ORDER — PROMETHAZINE HCL 12.5 MG PO TABS: 12.5000 mg | ORAL_TABLET | Freq: Four times a day (QID) | ORAL | Status: DC | PRN

## 2015-07-20 SURGICAL SUPPLY — 22 items
ADHESIVE MASTISOL STRL (MISCELLANEOUS) ×6 IMPLANT
AQUAPLAST 1/16X18X24 SGL WHT (MISCELLANEOUS) ×3
CANISTER SUCT 1200ML W/VALVE (MISCELLANEOUS) ×3 IMPLANT
CLOSURE WOUND 1/2 X4 (GAUZE/BANDAGES/DRESSINGS) ×1
CLOSURE WOUND 1/4X4 (GAUZE/BANDAGES/DRESSINGS)
CNTNR SPEC 2.5X3XGRAD LEK (MISCELLANEOUS) ×1
COAG SUCT 10F 3.5MM HAND CTRL (MISCELLANEOUS) ×3 IMPLANT
CONT SPEC 4OZ STER OR WHT (MISCELLANEOUS) ×2
CONTAINER SPEC 2.5X3XGRAD LEK (MISCELLANEOUS) ×1 IMPLANT
CUP MEDICINE 2OZ PLAST GRAD ST (MISCELLANEOUS) ×3 IMPLANT
ELECT REM PT RETURN 9FT ADLT (ELECTROSURGICAL) ×3
ELECTRODE REM PT RTRN 9FT ADLT (ELECTROSURGICAL) ×1 IMPLANT
GAUZE SPONGE 4X4 12PLY STRL (GAUZE/BANDAGES/DRESSINGS) ×3 IMPLANT
GLOVE BIO SURGEON STRL SZ7.5 (GLOVE) ×3 IMPLANT
GOWN STRL REUS W/ TWL LRG LVL3 (GOWN DISPOSABLE) ×2 IMPLANT
GOWN STRL REUS W/TWL LRG LVL3 (GOWN DISPOSABLE) ×4
SPLINT AQUAPLAST 1/16X18X24 SG (MISCELLANEOUS) ×1 IMPLANT
SPONGE NEURO XRAY DETECT 1X3 (DISPOSABLE) ×3 IMPLANT
STRIP CLOSURE SKIN 1/2X4 (GAUZE/BANDAGES/DRESSINGS) ×2 IMPLANT
STRIP CLOSURE SKIN 1/4X4 (GAUZE/BANDAGES/DRESSINGS) IMPLANT
TUBING CONNECTING 10 (TUBING) ×2 IMPLANT
TUBING CONNECTING 10' (TUBING) ×1

## 2015-07-20 NOTE — Discharge Instructions (Signed)

## 2015-07-20 NOTE — H&P (Signed)
..  History and Physical paper copy reviewed and updated date of procedure and will be scanned into system.  

## 2015-07-20 NOTE — Op Note (Signed)
..  07/20/2015  7:51 AM    Holly Todd  [id number]    Pre-Op Dx:  Nasal fracture with nasal deformity  Post-op Dx: Same  Proc: Closed Reduction of Nasal Bone Fracture  Surg:  [facility]rell Martus  Anes:  GOT  EBL:  10  Comp:  none  Findings: Leftward deviation of nasal bridge with concavity on right and convexity on left successfully reduced in a closed fashion, thermaplast splint placed for support.  Procedure: With the patient in a comfortable supine position,  general orotracheal anesthesia was induced without difficulty.  The patient received preoperative Afrin spray for topical decongestion and vasoconstriction.  At an appropriate level, the patient was placed in a semi-sitting position.  The nose was evaluated and noted to have concavity on right side with convexity on left side of nasal bone from previous fracture.  [facility], the nasal bone was lifted superiorly and moved to the patient's right side resulting in a straightened nasal dorsum and reduction on indentation on patient's right and resolution of palpable deformity on patient's left.  Visualization of the nose showed persistent nasal dorsal straightening.  Small amount of bleeding occurred on the patient's left side.  This stopped easily with Afrin pledgets.  At this time, the thermaplast splint was fashioned in a normal fashion.  Mastisol was placed on the patient's nasal dorsum and steri-strips.  The thermaplast was placed and secured with a straightened nasal dorsum.  The patient's nasal cavity was noted to have a right sided septal deviation that was present prior but the patient did not wish to fix at this time.  The materials were removed from the nose and observed to be intact and correct in number with continued hemostasis.   The patient was awakened from anesthesia without difficulty and taken to PACU in good condition.   Dispo:   PACU to home  Plan: Ice, elevation, narcotic analgesia.    We will reevaluate the patient in the office in 7 days, strenuous activities in two weeks.   Holly Todd 07/20/2015 7:51 AM

## 2015-07-20 NOTE — Anesthesia Procedure Notes (Signed)
Procedure Name: Intubation Date/Time: 07/20/2015 7:31 AM Performed by: Stormy FabianURTIS, Cydni Pre-anesthesia Checklist: Patient identified, Patient being monitored, Timeout performed, Emergency Drugs available and Suction available Patient Re-evaluated:Patient Re-evaluated prior to inductionOxygen Delivery Method: Circle system utilized Preoxygenation: Pre-oxygenation with 100% oxygen Intubation Type: IV induction Ventilation: Mask ventilation without difficulty Laryngoscope Size: Mac and 3 Grade View: Grade I Tube type: Oral Rae Tube size: 7.5 mm Number of attempts: 1 Airway Equipment and Method: Stylet Placement Confirmation: ETT inserted through vocal cords under direct vision,  positive ETCO2 and breath sounds checked- equal and bilateral Secured at: 21 cm Tube secured with: Tape Dental Injury: Teeth and Oropharynx as per pre-operative assessment

## 2015-07-20 NOTE — Anesthesia Preprocedure Evaluation (Signed)
Anesthesia Evaluation  Patient identified by MRN, date of birth, ID band Patient awake    Reviewed: Allergy & Precautions, NPO status , Patient's Chart, lab work & pertinent test results, reviewed documented beta blocker date and time   Airway Mallampati: II  TM Distance: <3 FB Neck ROM: Full   Comment: Large tonsils Dental  (+) Chipped   Pulmonary asthma ,    Pulmonary exam normal        Cardiovascular hypertension, Pt. on medications and Pt. on home beta blockers Normal cardiovascular exam+ Valvular Problems/Murmurs      Neuro/Psych negative neurological ROS  negative psych ROS   GI/Hepatic negative GI ROS, Neg liver ROS,   Endo/Other  negative endocrine ROS  Renal/GU negative Renal ROS  negative genitourinary   Musculoskeletal negative musculoskeletal ROS (+)   Abdominal (+) + obese,   Peds negative pediatric ROS (+)  Hematology negative hematology ROS (+)   Anesthesia Other Findings   Reproductive/Obstetrics                             Anesthesia Physical Anesthesia Plan  ASA: III  Anesthesia Plan: General   Post-op Pain Management:    Induction: Intravenous  Airway Management Planned: Oral ETT  Additional Equipment:   Intra-op Plan:   Post-operative Plan: Extubation in OR  Informed Consent: I have reviewed the patients History and Physical, chart, labs and discussed the procedure including the risks, benefits and alternatives for the proposed anesthesia with the patient or authorized representative who has indicated his/her understanding and acceptance.   Dental advisory given  Plan Discussed with: CRNA and Surgeon  Anesthesia Plan Comments:         Anesthesia Quick Evaluation

## 2015-07-20 NOTE — Transfer of Care (Signed)
Immediate Anesthesia Transfer of Care Note  Patient: Holly Todd  Procedure(s) Performed: Procedure(s): CLOSED REDUCTION NASAL FRACTURE (N/A)  Patient Location: PACU  Anesthesia Type:General  Level of Consciousness: sedated  Airway & Oxygen Therapy: Patient Spontanous Breathing and Patient connected to face mask oxygen  Post-op Assessment: Report given to RN and Post -op Vital signs reviewed and stable  Post vital signs: Reviewed and stable  Last Vitals:  Filed Vitals:   07/20/15 0611 07/20/15 0803  BP: 138/82 134/84  Pulse: 69 76  Temp: 36.5 C 36.6 C  Resp: 16 13    Complications: No apparent anesthesia complications

## 2015-07-21 NOTE — Anesthesia Postprocedure Evaluation (Signed)
Anesthesia Post Note  Patient: Holly Todd  Procedure(s) Performed: Procedure(s) (LRB): CLOSED REDUCTION NASAL FRACTURE (N/A)  Patient location during evaluation: PACU Anesthesia Type: General Level of consciousness: awake and alert and oriented Pain management: pain level controlled Vital Signs Assessment: post-procedure vital signs reviewed and stable Respiratory status: spontaneous breathing Cardiovascular status: blood pressure returned to baseline Anesthetic complications: no    Last Vitals:  Filed Vitals:   07/20/15 0857 07/20/15 0900  BP: 166/83 157/91  Pulse: 63 74  Temp: 36.2 C   Resp: 14     Last Pain:  Filed Vitals:   07/20/15 0927  PainSc: 4                  Chrishawna Farina

## 2015-10-30 ENCOUNTER — Ambulatory Visit: Payer: Self-pay | Admitting: Physician Assistant

## 2015-10-30 VITALS — BP 130/100 | HR 77 | Temp 98.1°F

## 2015-10-30 DIAGNOSIS — J209 Acute bronchitis, unspecified: Secondary | ICD-10-CM

## 2015-10-30 MED ORDER — BENZONATATE 200 MG PO CAPS: 200.0000 mg | ORAL_CAPSULE | Freq: Three times a day (TID) | ORAL | Status: DC | PRN

## 2015-10-30 MED ORDER — AMOXICILLIN 875 MG PO TABS: 875.0000 mg | ORAL_TABLET | Freq: Two times a day (BID) | ORAL | Status: DC

## 2015-10-30 NOTE — Progress Notes (Signed)
S: cough, congestion but no fever.  Using inhaler with control of asthma.  S/t due to coughing.  O: TMS dull bilat, nose boggy, throat injected with drainage, neck supple without aden.  Lungs clear with congested cough.  Heart RRR A: acute bronchitis  P: amoxil 875 x 10 days and tessalon 200mg  tid prn cough.  Continue inhalers

## 2015-10-31 NOTE — Progress Notes (Signed)
Patient ID: Holly Todd, female   DOB: 1970-08-10, 45 y.o.   MRN: Meriel [id number] Patient called and expressed that her cough has gotten worse and the Tessalon perls aren't working.  Her asthma has flared up and she is using her inhaler often.  I advised Durward Parcelon Smith, PA-C and he authorized me to call in a Medrol Dose Pack in to [facility] on [address].

## 2015-12-12 ENCOUNTER — Other Ambulatory Visit: Payer: Self-pay

## 2015-12-12 DIAGNOSIS — Z299 Encounter for prophylactic measures, unspecified: Secondary | ICD-10-CM

## 2015-12-12 NOTE — Progress Notes (Signed)
Patient came in to have blood drawn per Chelsa Holland's orders.

## 2015-12-13 LAB — CMP12+LP+TP+TSH+6AC+CBC/D/PLT
ALBUMIN: 4.1 g/dL (ref 3.5–5.5)
ALK PHOS: 60 IU/L (ref 39–117)
ALT: 14 IU/L (ref 0–32)
AST: 14 IU/L (ref 0–40)
Albumin/Globulin Ratio: 1.8 (ref 1.2–2.2)
BASOS: 1 %
BUN/Creatinine Ratio: 22 (ref 9–23)
BUN: 18 mg/dL (ref 6–24)
Basophils Absolute: 0 10*3/uL (ref 0.0–0.2)
Bilirubin Total: 0.4 mg/dL (ref 0.0–1.2)
CALCIUM: 9 mg/dL (ref 8.7–10.2)
CHLORIDE: 101 mmol/L (ref 96–106)
CHOL/HDL RATIO: 5.1 ratio — AB (ref 0.0–4.4)
CREATININE: 0.82 mg/dL (ref 0.57–1.00)
Cholesterol, Total: 213 mg/dL — ABNORMAL HIGH (ref 100–199)
EOS (ABSOLUTE): 0.4 10*3/uL (ref 0.0–0.4)
ESTIMATED CHD RISK: 1.3 times avg. — AB (ref 0.0–1.0)
Eos: 6 %
Free Thyroxine Index: 1.7 (ref 1.2–4.9)
GFR calc Af Amer: 99 mL/min/{1.73_m2} (ref >59)
GFR, EST NON AFRICAN AMERICAN: 86 mL/min/{1.73_m2} (ref >59)
GGT: 14 IU/L (ref 0–60)
Globulin, Total: 2.3 g/dL (ref 1.5–4.5)
Glucose: 93 mg/dL (ref 65–99)
HDL: 42 mg/dL (ref >39)
HEMATOCRIT: 38.5 % (ref 34.0–46.6)
Hemoglobin: 12.8 g/dL (ref 11.1–15.9)
IMMATURE GRANS (ABS): 0 10*3/uL (ref 0.0–0.1)
Immature Granulocytes: 0 %
Iron: 75 ug/dL (ref 27–159)
LDH: 163 IU/L (ref 119–226)
LDL CALC: 149 mg/dL — AB (ref 0–99)
LYMPHS ABS: 1.9 10*3/uL (ref 0.7–3.1)
Lymphs: 30 %
MCH: 29.9 pg (ref 26.6–33.0)
MCHC: 33.2 g/dL (ref 31.5–35.7)
MCV: 90 fL (ref 79–97)
MONOS ABS: 0.6 10*3/uL (ref 0.1–0.9)
Monocytes: 9 %
NEUTROS ABS: 3.5 10*3/uL (ref 1.4–7.0)
Neutrophils: 54 %
PHOSPHORUS: 3.2 mg/dL (ref 2.5–4.5)
POTASSIUM: 4.5 mmol/L (ref 3.5–5.2)
Platelets: 294 10*3/uL (ref 150–379)
RBC: 4.28 x10E6/uL (ref 3.77–5.28)
RDW: 13 % (ref 12.3–15.4)
SODIUM: 140 mmol/L (ref 134–144)
T3 Uptake Ratio: 28 % (ref 24–39)
T4, Total: 6 ug/dL (ref 4.5–12.0)
TSH: 2.45 u[IU]/mL (ref 0.450–4.500)
Total Protein: 6.4 g/dL (ref 6.0–8.5)
Triglycerides: 110 mg/dL (ref 0–149)
Uric Acid: 6.2 mg/dL (ref 2.5–7.1)
VLDL Cholesterol Cal: 22 mg/dL (ref 5–40)
WBC: 6.4 10*3/uL (ref 3.4–10.8)

## 2015-12-18 ENCOUNTER — Other Ambulatory Visit: Payer: Self-pay | Admitting: Nurse Practitioner

## 2015-12-18 DIAGNOSIS — Z1231 Encounter for screening mammogram for malignant neoplasm of breast: Secondary | ICD-10-CM

## 2016-01-03 ENCOUNTER — Ambulatory Visit: Payer: Managed Care, Other (non HMO)

## 2016-01-17 ENCOUNTER — Ambulatory Visit: Payer: Managed Care, Other (non HMO)

## 2016-02-07 ENCOUNTER — Encounter: Payer: Self-pay | Admitting: Physician Assistant

## 2016-02-07 ENCOUNTER — Ambulatory Visit: Payer: Self-pay | Admitting: Physician Assistant

## 2016-02-07 VITALS — BP 130/89 | HR 100 | Temp 97.6°F

## 2016-02-07 DIAGNOSIS — J069 Acute upper respiratory infection, unspecified: Secondary | ICD-10-CM

## 2016-02-07 NOTE — Progress Notes (Signed)
   Subjective: URI    Patient ID: Holly FlavorsLinda M Hellickson, female    DOB: 01-Nov-1970, 45 y.o.   MRN: [id number]  HPI Patient c/o one week of sinus pressure, post nasal drainage, and cough.  Denies fever/chill, or N/V/D   Review of Systems HTN    Objective:   Physical Exam No acute distress. HEENT for bilateral Maxillary guarding, edematous nasal turbinates, and post nasal drainage. Neck supple without adenopathy. Lungs with Rales and productive cough. Heart RRR.       Assessment & Plan:URI  Patient given discharge care instructions. Advised to take Bactrim DS and Bromfed DM as directed.  Follow up if no improvement or worsen of compliant.

## 2016-06-06 ENCOUNTER — Ambulatory Visit: Payer: Self-pay | Admitting: Physician Assistant

## 2016-06-06 VITALS — BP 130/80 | HR 66 | Temp 97.7°F | Ht 62.0 in | Wt 238.0 lb

## 2016-06-06 DIAGNOSIS — Z299 Encounter for prophylactic measures, unspecified: Secondary | ICD-10-CM

## 2016-06-06 NOTE — Progress Notes (Signed)
Patient came in to have blood drawn for [facility].  Patient also had us to fill out her Biometric form.

## 2016-06-07 LAB — CMP12+LP+TP+TSH+6AC+CBC/D/PLT
ALT: 19 IU/L (ref 0–32)
AST: 13 IU/L (ref 0–40)
Albumin/Globulin Ratio: 2 (ref 1.2–2.2)
Albumin: 4.3 g/dL (ref 3.5–5.5)
Alkaline Phosphatase: 56 IU/L (ref 39–117)
BASOS ABS: 0 10*3/uL (ref 0.0–0.2)
BUN / CREAT RATIO: 17 (ref 9–23)
BUN: 14 mg/dL (ref 6–24)
Basos: 1 %
Bilirubin Total: 0.3 mg/dL (ref 0.0–1.2)
CALCIUM: 9.2 mg/dL (ref 8.7–10.2)
CHOL/HDL RATIO: 6.1 ratio — AB (ref 0.0–4.4)
Chloride: 101 mmol/L (ref 96–106)
Cholesterol, Total: 227 mg/dL — ABNORMAL HIGH (ref 100–199)
Creatinine, Ser: 0.84 mg/dL (ref 0.57–1.00)
EOS (ABSOLUTE): 0.3 10*3/uL (ref 0.0–0.4)
EOS: 5 %
Estimated CHD Risk: 1.7 times avg. — ABNORMAL HIGH (ref 0.0–1.0)
Free Thyroxine Index: 1.6 (ref 1.2–4.9)
GFR calc Af Amer: 96 mL/min/{1.73_m2} (ref >59)
GFR calc non Af Amer: 84 mL/min/{1.73_m2} (ref >59)
GGT: 14 IU/L (ref 0–60)
GLOBULIN, TOTAL: 2.1 g/dL (ref 1.5–4.5)
GLUCOSE: 98 mg/dL (ref 65–99)
HDL: 37 mg/dL — ABNORMAL LOW (ref >39)
Hematocrit: 39.6 % (ref 34.0–46.6)
Hemoglobin: 13.4 g/dL (ref 11.1–15.9)
IMMATURE GRANS (ABS): 0 10*3/uL (ref 0.0–0.1)
Immature Granulocytes: 0 %
Iron: 94 ug/dL (ref 27–159)
LDH: 167 IU/L (ref 119–226)
LDL Calculated: 154 mg/dL — ABNORMAL HIGH (ref 0–99)
LYMPHS ABS: 2.1 10*3/uL (ref 0.7–3.1)
LYMPHS: 33 %
MCH: 29.3 pg (ref 26.6–33.0)
MCHC: 33.8 g/dL (ref 31.5–35.7)
MCV: 87 fL (ref 79–97)
MONOCYTES: 7 %
Monocytes Absolute: 0.4 10*3/uL (ref 0.1–0.9)
NEUTROS PCT: 54 %
Neutrophils Absolute: 3.4 10*3/uL (ref 1.4–7.0)
PHOSPHORUS: 3.7 mg/dL (ref 2.5–4.5)
PLATELETS: 325 10*3/uL (ref 150–379)
Potassium: 4.4 mmol/L (ref 3.5–5.2)
RBC: 4.58 x10E6/uL (ref 3.77–5.28)
RDW: 14.1 % (ref 12.3–15.4)
Sodium: 142 mmol/L (ref 134–144)
T3 Uptake Ratio: 25 % (ref 24–39)
T4 TOTAL: 6.4 ug/dL (ref 4.5–12.0)
TSH: 2.18 u[IU]/mL (ref 0.450–4.500)
Total Protein: 6.4 g/dL (ref 6.0–8.5)
Triglycerides: 181 mg/dL — ABNORMAL HIGH (ref 0–149)
URIC ACID: 6.5 mg/dL (ref 2.5–7.1)
VLDL Cholesterol Cal: 36 mg/dL (ref 5–40)
WBC: 6.2 10*3/uL (ref 3.4–10.8)

## 2016-06-07 LAB — VITAMIN D 25 HYDROXY (VIT D DEFICIENCY, FRACTURES): VIT D 25 HYDROXY: 18.8 ng/mL — AB (ref 30.0–100.0)

## 2016-11-18 ENCOUNTER — Ambulatory Visit: Payer: Self-pay | Admitting: Physician Assistant

## 2016-11-18 ENCOUNTER — Encounter: Payer: Self-pay | Admitting: Physician Assistant

## 2016-11-18 VITALS — BP 140/85 | HR 63 | Temp 98.5°F | Resp 16

## 2016-11-18 DIAGNOSIS — M5412 Radiculopathy, cervical region: Secondary | ICD-10-CM

## 2016-11-18 MED ORDER — BACLOFEN 10 MG PO TABS: 10.0000 mg | ORAL_TABLET | Freq: Three times a day (TID) | ORAL | 0 refills | Status: DC

## 2016-11-18 MED ORDER — METHYLPREDNISOLONE 4 MG PO TBPK: ORAL_TABLET | ORAL | 0 refills | Status: DC

## 2016-11-18 NOTE — Progress Notes (Signed)
S: c/o r shoulder and arm pain, pain is tingling, relieved by raising her arm over her head, increased pain when she leans her head back, sx for 2 days, no known injury, no otc meds, did use ice  O: vitals wnl nad, cspine is not tendern, left shoulder is tender in muscles along spasms, decreased rom of neck with hyperextension and rotation to the right, grips = b/l, n/v intact  A: cervical radiculopathy secondary to muscle spasms  P: medrol dose pack, baclofen, ice, if not better in 1 week return for reeval

## 2016-12-02 NOTE — Progress Notes (Signed)
Patient was referred to see Altamese Cabal, PA at [facility] on 12/03/16 at 9am per Susan's authorization.  Patient has been notified of her appointment.

## 2016-12-03 DIAGNOSIS — M5412 Radiculopathy, cervical region: Secondary | ICD-10-CM | POA: Insufficient documentation

## 2016-12-03 DIAGNOSIS — M542 Cervicalgia: Secondary | ICD-10-CM | POA: Insufficient documentation

## 2017-01-02 ENCOUNTER — Other Ambulatory Visit: Payer: Self-pay

## 2017-01-02 DIAGNOSIS — Z299 Encounter for prophylactic measures, unspecified: Secondary | ICD-10-CM

## 2017-01-02 NOTE — Progress Notes (Signed)
Patient came in to have blood drawn for testing per Dr. Tejan-sie's orders. 

## 2017-01-03 LAB — CMP12+LP+TP+TSH+6AC+CBC/D/PLT
ALT: 13 IU/L (ref 0–32)
AST: 14 IU/L (ref 0–40)
Albumin/Globulin Ratio: 1.7 (ref 1.2–2.2)
Albumin: 4.2 g/dL (ref 3.5–5.5)
Alkaline Phosphatase: 61 IU/L (ref 39–117)
BUN/Creatinine Ratio: 23 (ref 9–23)
BUN: 19 mg/dL (ref 6–24)
Basophils Absolute: 0 10*3/uL (ref 0.0–0.2)
Basos: 0 %
Bilirubin Total: 0.4 mg/dL (ref 0.0–1.2)
CHOL/HDL RATIO: 5.8 ratio — AB (ref 0.0–4.4)
CREATININE: 0.83 mg/dL (ref 0.57–1.00)
Calcium: 9.6 mg/dL (ref 8.7–10.2)
Chloride: 103 mmol/L (ref 96–106)
Cholesterol, Total: 222 mg/dL — ABNORMAL HIGH (ref 100–199)
EOS (ABSOLUTE): 0.5 10*3/uL — AB (ref 0.0–0.4)
Eos: 7 %
Estimated CHD Risk: 1.6 times avg. — ABNORMAL HIGH (ref 0.0–1.0)
Free Thyroxine Index: 1.6 (ref 1.2–4.9)
GFR calc Af Amer: 97 mL/min/{1.73_m2} (ref >59)
GFR, EST NON AFRICAN AMERICAN: 84 mL/min/{1.73_m2} (ref >59)
GGT: 15 IU/L (ref 0–60)
GLOBULIN, TOTAL: 2.5 g/dL (ref 1.5–4.5)
GLUCOSE: 96 mg/dL (ref 65–99)
HDL: 38 mg/dL — ABNORMAL LOW (ref >39)
Hematocrit: 41.8 % (ref 34.0–46.6)
Hemoglobin: 14.1 g/dL (ref 11.1–15.9)
IRON: 82 ug/dL (ref 27–159)
Immature Grans (Abs): 0 10*3/uL (ref 0.0–0.1)
Immature Granulocytes: 0 %
LDH: 172 IU/L (ref 119–226)
LDL Calculated: 141 mg/dL — ABNORMAL HIGH (ref 0–99)
LYMPHS ABS: 2.8 10*3/uL (ref 0.7–3.1)
Lymphs: 35 %
MCH: 29.9 pg (ref 26.6–33.0)
MCHC: 33.7 g/dL (ref 31.5–35.7)
MCV: 89 fL (ref 79–97)
MONOS ABS: 0.4 10*3/uL (ref 0.1–0.9)
Monocytes: 5 %
Neutrophils Absolute: 4.2 10*3/uL (ref 1.4–7.0)
Neutrophils: 53 %
POTASSIUM: 4.2 mmol/L (ref 3.5–5.2)
Phosphorus: 3.8 mg/dL (ref 2.5–4.5)
Platelets: 334 10*3/uL (ref 150–379)
RBC: 4.72 x10E6/uL (ref 3.77–5.28)
RDW: 13.7 % (ref 12.3–15.4)
Sodium: 143 mmol/L (ref 134–144)
T3 UPTAKE RATIO: 25 % (ref 24–39)
T4 TOTAL: 6.4 ug/dL (ref 4.5–12.0)
TOTAL PROTEIN: 6.7 g/dL (ref 6.0–8.5)
TSH: 1.78 u[IU]/mL (ref 0.450–4.500)
Triglycerides: 214 mg/dL — ABNORMAL HIGH (ref 0–149)
URIC ACID: 5.9 mg/dL (ref 2.5–7.1)
VLDL Cholesterol Cal: 43 mg/dL — ABNORMAL HIGH (ref 5–40)
WBC: 7.9 10*3/uL (ref 3.4–10.8)

## 2017-01-19 ENCOUNTER — Other Ambulatory Visit: Payer: Self-pay | Admitting: Nurse Practitioner

## 2017-01-19 DIAGNOSIS — Z1231 Encounter for screening mammogram for malignant neoplasm of breast: Secondary | ICD-10-CM

## 2017-02-20 ENCOUNTER — Other Ambulatory Visit: Payer: Self-pay

## 2017-02-20 ENCOUNTER — Emergency Department: Payer: Managed Care, Other (non HMO)

## 2017-02-20 ENCOUNTER — Encounter: Payer: Self-pay | Admitting: Emergency Medicine

## 2017-02-20 DIAGNOSIS — Z79899 Other long term (current) drug therapy: Secondary | ICD-10-CM | POA: Insufficient documentation

## 2017-02-20 DIAGNOSIS — J45909 Unspecified asthma, uncomplicated: Secondary | ICD-10-CM | POA: Diagnosis not present

## 2017-02-20 DIAGNOSIS — R079 Chest pain, unspecified: Secondary | ICD-10-CM | POA: Insufficient documentation

## 2017-02-20 DIAGNOSIS — I10 Essential (primary) hypertension: Secondary | ICD-10-CM | POA: Diagnosis not present

## 2017-02-20 DIAGNOSIS — M25512 Pain in left shoulder: Secondary | ICD-10-CM | POA: Insufficient documentation
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
## 2017-02-21 ENCOUNTER — Emergency Department
Admission: EM | Admit: 2017-02-21 | Discharge: 2017-02-21 | Disposition: A | Discharge status: Home / Self Care | Payer: Managed Care, Other (non HMO) | Attending: Emergency Medicine | Admitting: Emergency Medicine

## 2017-02-21 DIAGNOSIS — M7918 Myalgia, other site: Secondary | ICD-10-CM
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
## 2017-02-21 NOTE — Discharge Instructions (Signed)

## 2017-02-23 ENCOUNTER — Ambulatory Visit
Admission: RE | Admit: 2017-02-23 | Discharge: 2017-02-23 | Disposition: A | Discharge status: Home / Self Care | Payer: Managed Care, Other (non HMO) | Source: Ambulatory Visit | Attending: Nurse Practitioner | Admitting: Nurse Practitioner

## 2017-02-23 DIAGNOSIS — R928 Other abnormal and inconclusive findings on diagnostic imaging of breast: Secondary | ICD-10-CM | POA: Diagnosis not present

## 2017-02-23 DIAGNOSIS — Z1231 Encounter for screening mammogram for malignant neoplasm of breast: Secondary | ICD-10-CM | POA: Insufficient documentation

## 2017-02-25 ENCOUNTER — Other Ambulatory Visit: Payer: Self-pay | Admitting: Nurse Practitioner

## 2017-02-25 DIAGNOSIS — R928 Other abnormal and inconclusive findings on diagnostic imaging of breast: Secondary | ICD-10-CM

## 2017-03-12 ENCOUNTER — Ambulatory Visit
Admission: RE | Admit: 2017-03-12 | Discharge: 2017-03-12 | Disposition: A | Discharge status: Home / Self Care | Payer: Managed Care, Other (non HMO) | Source: Ambulatory Visit | Attending: Nurse Practitioner | Admitting: Nurse Practitioner

## 2017-03-12 DIAGNOSIS — R928 Other abnormal and inconclusive findings on diagnostic imaging of breast: Secondary | ICD-10-CM | POA: Diagnosis not present

## 2017-03-12 DIAGNOSIS — N6489 Other specified disorders of breast: Secondary | ICD-10-CM | POA: Insufficient documentation

## 2017-04-02 ENCOUNTER — Other Ambulatory Visit: Payer: Self-pay

## 2017-04-02 ENCOUNTER — Emergency Department: Payer: Managed Care, Other (non HMO)

## 2017-04-02 ENCOUNTER — Emergency Department
Admission: EM | Admit: 2017-04-02 | Discharge: 2017-04-02 | Disposition: A | Discharge status: Home / Self Care | Payer: Managed Care, Other (non HMO) | Attending: Emergency Medicine | Admitting: Emergency Medicine

## 2017-04-02 DIAGNOSIS — Z79899 Other long term (current) drug therapy: Secondary | ICD-10-CM | POA: Insufficient documentation

## 2017-04-02 DIAGNOSIS — I10 Essential (primary) hypertension: Secondary | ICD-10-CM | POA: Diagnosis not present

## 2017-04-02 DIAGNOSIS — J45909 Unspecified asthma, uncomplicated: Secondary | ICD-10-CM | POA: Diagnosis not present

## 2017-04-02 DIAGNOSIS — M25562 Pain in left knee: Secondary | ICD-10-CM | POA: Insufficient documentation

## 2017-04-02 MED ORDER — MELOXICAM 15 MG PO TABS: 15.0000 mg | ORAL_TABLET | Freq: Every day | ORAL | 1 refills | Status: AC

## 2017-04-02 MED ORDER — TRAMADOL HCL 50 MG PO TABS: 50.0000 mg | ORAL_TABLET | Freq: Four times a day (QID) | ORAL | 0 refills | Status: AC | PRN

## 2017-04-02 NOTE — ED Provider Notes (Signed)
[facility] Emergency Department Provider Note  ____________________________________________  Time seen: Approximately 11:24 PM  I have reviewed the triage vital signs and the nursing notes.   HISTORY  Chief Complaint Knee Injury    HPI Holly Todd is a 46 y.o. female presents to the emergency department with left knee pain.  Patient reports that she was descending down stairs when she "heard a pop.  Patient describes pain as occurring primarily along the medial joint line.  Patient reports that pain is worsened with extension and relieved with 35 degrees of flexion.  Patient denies radiculopathy, weakness or changes in sensation.  Patient sustained no falls this evening.  Patient does not have a history of left knee pain.  No alleviating measures attempted.  Past Medical History:  Diagnosis Date  . Asthma   . Heart murmur   . Hypertension     There are no active problems to display for this patient.   Past Surgical History:  Procedure Laterality Date  . CESAREAN SECTION    . CLOSED REDUCTION NASAL FRACTURE N/A 07/20/2015   Procedure: CLOSED REDUCTION NASAL FRACTURE;  Surgeon: Bud Facereighton Vaught, MD;  Location: [facility] ORS;  Service: ENT;  Laterality: N/A;  . TUBAL LIGATION      Prior to Admission medications   Medication Sig Start Date End Date Taking? Authorizing Provider  baclofen (LIORESAL) 10 MG tablet Take 1 tablet (10 mg total) by mouth 3 (three) times daily. 11/18/16   Fisher, Roselyn BeringSusan W, PA-C  hydrochlorothiazide (MICROZIDE) 12.5 MG capsule Take 12.5 mg by mouth every morning.    [provider]  lisinopril (PRINIVIL,ZESTRIL) 40 MG tablet Take 40 mg by mouth every morning.    [provider]  meloxicam (MOBIC) 15 MG tablet Take 1 tablet (15 mg total) by mouth daily for 7 days. 04/02/17 04/09/17  Orvil FeilWoods, Jaclyn M, PA-C  methylPREDNISolone (MEDROL DOSEPAK) 4 MG TBPK tablet Take 6 pills on day one then decrease by 1 pill each day  11/18/16   Faythe GheeFisher, Susan W, PA-C  traMADol (ULTRAM) 50 MG tablet Take 1 tablet (50 mg total) by mouth every 6 (six) hours as needed for up to 3 days. 04/02/17 04/05/17  Orvil FeilWoods, Jaclyn M, PA-C    Allergies Norvasc [amlodipine besylate]  No family history on file.  Social History Social History   Tobacco Use  . Smoking status: Never Smoker  . Smokeless tobacco: Never Used  Substance Use Topics  . Alcohol use: No    Alcohol/week: 0.0 oz  . Drug use: No     Review of Systems  Constitutional: No fever/chills Eyes: No visual changes. No discharge ENT: No upper respiratory complaints. Cardiovascular: no chest pain. Respiratory: no cough. No SOB. Musculoskeletal: Patient has left knee pain. Skin: Negative for rash, abrasions, lacerations, ecchymosis. Neurological: Negative for headaches, focal weakness or numbness.  ____________________________________________   PHYSICAL EXAM:  VITAL SIGNS: ED Triage Vitals  Enc Vitals Group     BP 04/02/17 2140 (!) 141/72     Pulse Rate 04/02/17 2140 69     Resp 04/02/17 2140 18     Temp 04/02/17 2142 99.3 F (37.4 C)     Temp Source 04/02/17 2142 Oral     SpO2 04/02/17 2140 98 %     Weight 04/02/17 2141 220 lb (99.8 kg)     Height 04/02/17 2141 5\' 3"  (1.6 m)     Head Circumference --      Peak Flow --  Pain Score 04/02/17 2140 7     Pain Loc --      Pain Edu? --      Excl. in GC? --      Constitutional: Alert and oriented. Well appearing and in no acute distress. Eyes: Conjunctivae are normal. PERRL. EOMI. Head: Atraumatic. Cardiovascular: Normal rate, regular rhythm. Normal S1 and S2.  Good peripheral circulation. Respiratory: Normal respiratory effort without tachypnea or retractions. Lungs CTAB. Good air entry to the bases with no decreased or absent breath sounds. Musculoskeletal: Patient is able to perform limited range of motion at the left knee, likely secondary to pain.  Significant tenderness was elicited with  palpation over the medial joint line.  Negative anterior and posterior drawer test.  No laxity was elicited with MCL and LCL testing.  Negative apprehension test.  No ballottement.  Palpable dorsalis pedis pulse bilaterally and symmetrically. Neurologic:  Normal speech and language. No gross focal neurologic deficits are appreciated.  Skin:  Skin is warm, dry and intact. No rash noted. Psychiatric: Mood and affect are normal. Speech and behavior are normal. Patient exhibits appropriate insight and judgement.   ____________________________________________   LABS (all labs ordered are listed, but only abnormal results are displayed)  Labs Reviewed - No data to display ____________________________________________  EKG   ____________________________________________  RADIOLOGY Geraldo Pitter, personally viewed and evaluated these images (plain radiographs) as part of my medical decision making, as well as reviewing the written report by the radiologist.  Dg Knee Complete 4 Views Left  Result Date: 04/02/2017 CLINICAL DATA:  Left knee pain EXAM: LEFT KNEE - COMPLETE 4+ VIEW COMPARISON:  None. FINDINGS: No evidence of fracture, dislocation, or joint effusion. No evidence of arthropathy or other focal bone abnormality. Soft tissues are unremarkable. IMPRESSION: Negative. Electronically Signed   By: Jasmine Pang M.D.   On: 04/02/2017 22:20    ____________________________________________    PROCEDURES  Procedure(s) performed:    Procedures    Medications - No data to display   ____________________________________________   INITIAL IMPRESSION / ASSESSMENT AND PLAN / ED COURSE  Pertinent labs & imaging results that were available during my care of the patient were reviewed by me and considered in my medical decision making (see chart for details).  Review of the [place] CSRS was performed in accordance of the [facility] prior to dispensing any controlled drugs.     Assessment and  plan Left knee pain Patient presents to the emergency department with significant left knee pain after descending downstairs.  Patient had no laxity with ACL, PCL or collateral ligament testing.  Patient had significant tenderness elicited with palpation over the medial joint line, increasing suspicion for meniscal pathology.  Compression and elevation were recommended  as well as ice.  Patient was referred to orthopedics, Dr. Rosita Kea.  Vital signs were reassuring prior to discharge.  All patient questions were answered.  ____________________________________________  FINAL CLINICAL IMPRESSION(S) / ED DIAGNOSES  Final diagnoses:  Acute pain of left knee      NEW MEDICATIONS STARTED DURING THIS VISIT:  ED Discharge Orders        Ordered    meloxicam (MOBIC) 15 MG tablet  Daily     04/02/17 2309    traMADol (ULTRAM) 50 MG tablet  Every 6 hours PRN     04/02/17 2309          This chart was dictated using voice recognition software/Dragon. Despite best efforts to proofread, errors can occur which can change  the meaning. Any change was purely unintentional.    Orvil FeilWoods, Jaclyn M, PA-C 04/02/17 2327    Dionne BucySiadecki, Sebastian, MD 04/02/17 [phone]

## 2017-04-02 NOTE — ED Triage Notes (Signed)
Pt in with co left knee pain states she stepped on it and felt a pop.

## 2017-04-02 NOTE — ED Notes (Signed)
Patient discharged to home per MD order. Patient in stable condition, and deemed medically cleared by ED provider for discharge. Discharge instructions reviewed with patient/family using "Teach Back"; verbalized understanding of medication education and administration, and information about follow-up care. Denies further concerns. ° °

## 2017-07-08 ENCOUNTER — Ambulatory Visit: Payer: Self-pay | Admitting: Family Medicine

## 2017-07-08 VITALS — BP 115/72 | HR 59 | Temp 98.5°F | Resp 16 | Ht 63.0 in | Wt 247.0 lb

## 2017-07-08 DIAGNOSIS — I2510 Atherosclerotic heart disease of native coronary artery without angina pectoris: Secondary | ICD-10-CM

## 2017-07-08 DIAGNOSIS — Z0189 Encounter for other specified special examinations: Principal | ICD-10-CM

## 2017-07-08 DIAGNOSIS — E785 Hyperlipidemia, unspecified: Secondary | ICD-10-CM

## 2017-07-08 DIAGNOSIS — Z008 Encounter for other general examination: Secondary | ICD-10-CM

## 2017-07-08 DIAGNOSIS — I10 Essential (primary) hypertension: Secondary | ICD-10-CM

## 2017-07-08 LAB — GLUCOSE, POCT (MANUAL RESULT ENTRY): POC Glucose: 104 mg/dl — AB (ref 70–99)

## 2017-07-08 NOTE — Progress Notes (Signed)
Subjective: Annual biometrics screening labs Patient presents for her annual biometric screening labs only, as she has had a comprehensive visit and physical exam with her primary care provider this year.  PCP: [facility] Patient denies any other issues or concerns.   Assessment Annual biometrics screening labs  Plan  Lipid panel pending. Encouraged routine visits with primary care provider.  Nonfasting blood sugar 104.  Discussed this with patient.

## 2017-07-09 LAB — COMPREHENSIVE METABOLIC PANEL
A/G RATIO: 1.6 (ref 1.2–2.2)
ALK PHOS: 56 IU/L (ref 39–117)
ALT: 10 IU/L (ref 0–32)
AST: 11 IU/L (ref 0–40)
Albumin: 4.1 g/dL (ref 3.5–5.5)
BILIRUBIN TOTAL: 0.4 mg/dL (ref 0.0–1.2)
BUN / CREAT RATIO: 15 (ref 9–23)
BUN: 13 mg/dL (ref 6–24)
CHLORIDE: 105 mmol/L (ref 96–106)
CO2: 20 mmol/L (ref 20–29)
Calcium: 9 mg/dL (ref 8.7–10.2)
Creatinine, Ser: 0.85 mg/dL (ref 0.57–1.00)
GFR calc Af Amer: 94 mL/min/{1.73_m2} (ref >59)
GFR calc non Af Amer: 82 mL/min/{1.73_m2} (ref >59)
GLUCOSE: 97 mg/dL (ref 65–99)
Globulin, Total: 2.5 g/dL (ref 1.5–4.5)
POTASSIUM: 4.6 mmol/L (ref 3.5–5.2)
Sodium: 140 mmol/L (ref 134–144)
Total Protein: 6.6 g/dL (ref 6.0–8.5)

## 2017-07-09 LAB — CBC WITH DIFFERENTIAL/PLATELET
BASOS: 0 %
Basophils Absolute: 0 10*3/uL (ref 0.0–0.2)
EOS (ABSOLUTE): 0.1 10*3/uL (ref 0.0–0.4)
EOS: 1 %
HEMATOCRIT: 47.1 % — AB (ref 34.0–46.6)
HEMOGLOBIN: 15.8 g/dL (ref 11.1–15.9)
IMMATURE GRANULOCYTES: 0 %
Immature Grans (Abs): 0 10*3/uL (ref 0.0–0.1)
Lymphocytes Absolute: 1.5 10*3/uL (ref 0.7–3.1)
Lymphs: 14 %
MCH: 31 pg (ref 26.6–33.0)
MCHC: 33.5 g/dL (ref 31.5–35.7)
MCV: 93 fL (ref 79–97)
MONOCYTES: 10 %
Monocytes Absolute: 1.1 10*3/uL — ABNORMAL HIGH (ref 0.1–0.9)
NEUTROS PCT: 75 %
Neutrophils Absolute: 8.6 10*3/uL — ABNORMAL HIGH (ref 1.4–7.0)
Platelets: 247 10*3/uL (ref 150–379)
RBC: 5.09 x10E6/uL (ref 3.77–5.28)
RDW: 14.1 % (ref 12.3–15.4)
WBC: 11.3 10*3/uL — AB (ref 3.4–10.8)

## 2017-07-09 LAB — LIPID PANEL
CHOLESTEROL TOTAL: 205 mg/dL — AB (ref 100–199)
Chol/HDL Ratio: 5.4 ratio — ABNORMAL HIGH (ref 0.0–4.4)
HDL: 38 mg/dL — AB (ref >39)
LDL Calculated: 134 mg/dL — ABNORMAL HIGH (ref 0–99)
Triglycerides: 164 mg/dL — ABNORMAL HIGH (ref 0–149)
VLDL Cholesterol Cal: 33 mg/dL (ref 5–40)
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
## 2017-08-04 ENCOUNTER — Ambulatory Visit: Payer: Self-pay

## 2017-08-04 ENCOUNTER — Encounter: Payer: Self-pay | Admitting: Family Medicine

## 2017-08-04 ENCOUNTER — Ambulatory Visit: Payer: Managed Care, Other (non HMO) | Admitting: Family Medicine

## 2017-08-04 VITALS — BP 122/80 | HR 71 | Ht 63.0 in | Wt 249.0 lb

## 2017-08-04 DIAGNOSIS — M25562 Pain in left knee: Secondary | ICD-10-CM

## 2017-08-04 DIAGNOSIS — M1712 Unilateral primary osteoarthritis, left knee: Secondary | ICD-10-CM

## 2017-08-04 DIAGNOSIS — M654 Radial styloid tenosynovitis [de Quervain]: Secondary | ICD-10-CM

## 2017-08-04 MED ORDER — DICLOFENAC SODIUM 2 % TD SOLN: 1.0000 "application " | Freq: Two times a day (BID) | TRANSDERMAL | 0 refills | Status: AC

## 2017-08-04 MED ORDER — DICLOFENAC SODIUM 2 % TD SOLN: 1.0000 "application " | Freq: Two times a day (BID) | TRANSDERMAL | 2 refills | Status: DC

## 2017-08-04 NOTE — Assessment & Plan Note (Signed)
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  Patient does have some lateral tracking of the kneecap with positive grinding.  Patient given home exercise, icing regimen, topical anti-inflammatories.  Encourage patient to try more low impact exercises at this time.  Patient will follow up with me again in 4 weeks.  Worsening symptoms consider injection and physical therapy.

## 2017-08-04 NOTE — Patient Instructions (Signed)
Good to see you  Ice is your friend Ice 20 minutes 2 times daily. Usually after activity and before bed. Exercises 3 times a week.  pennsaid pinkie amount topically 2 times daily as needed.  Wear the brace day and night for 1 week then nightly for 2 weeks.  Over the counter get  Vitamin D 2000 IU daily  Turmeric 500mg  daily  For the knee try to do more biking or elliptical for now then walking when you can  See me again in 4 weeks

## 2017-08-04 NOTE — Assessment & Plan Note (Signed)
Patient will do more bracing, topical anti-inflammatories and home exercises.  Worsening symptoms consider injections.  No underlying arthritic changes noted.

## 2017-09-06 NOTE — Progress Notes (Signed)
Holly Todd [address] [place], [place] [address] Phone: [phone] Subjective:     CC: Left knee pain, right wrist pain follow-up  DGU:[id number]  Holly Todd is a 47 y.o. female coming in with complaint of left knee pain.  Was found to have patellofemoral arthritis.  Did have an effusion.  Attempted physical therapy at home which patient only did intermittently with her traveling recently.  Continues to have same pain.  Worse with going up and down stairs.  Patient also found to have a right de Quervain's tenosynovitis.  She states it feels significantly better if she wears the brace but if she forgets she has severe pain in the morning it takes hours to finally resolved.  Certain movements to cause a sharp pain.     Past Medical History:  Diagnosis Date  . Asthma   . Heart murmur   . Hypertension    Past Surgical History:  Procedure Laterality Date  . CESAREAN SECTION    . CLOSED REDUCTION NASAL FRACTURE N/A 07/20/2015   Procedure: CLOSED REDUCTION NASAL FRACTURE;  Surgeon: Bud Face, MD;  Location: [facility] ORS;  Service: ENT;  Laterality: N/A;  . TUBAL LIGATION     Social History   Socioeconomic History  . Marital status: Married    Spouse name: Not on file  . Number of children: Not on file  . Years of education: Not on file  . Highest education level: Not on file  Occupational History  . Not on file  Social Needs  . Financial resource strain: Not on file  . Food insecurity:    Worry: Not on file    Inability: Not on file  . Transportation needs:    Medical: Not on file    Non-medical: Not on file  Tobacco Use  . Smoking status: Never Smoker  . Smokeless tobacco: Never Used  Substance and Sexual Activity  . Alcohol use: No    Alcohol/week: 0.0 oz  . Drug use: No  . Sexual activity: Not on file  Lifestyle  . Physical activity:    Days per week: Not on file    Minutes per session: Not on file  . Stress: Not on  file  Relationships  . Social connections:    Talks on phone: Not on file    Gets together: Not on file    Attends religious service: Not on file    Active member of club or organization: Not on file    Attends meetings of clubs or organizations: Not on file    Relationship status: Not on file  Other Topics Concern  . Not on file  Social History Narrative  . Not on file   Allergies  Allergen Reactions  . Norvasc [Amlodipine Besylate] Palpitations    Chest pain   No family history on file.  No family history of autoimmune   Past medical history, social, surgical and family history all reviewed in electronic medical record.  No pertanent information unless stated regarding to the chief complaint.   Review of Systems:Review of systems updated and as accurate as of 09/08/17  No headache, visual changes, nausea, vomiting, diarrhea, constipation, dizziness, abdominal pain, skin rash, fevers, chills, night sweats, weight loss, swollen lymph nodes, body aches, joint swelling, chest pain, shortness of breath, mood changes.  Positive muscle aches  Objective  Blood pressure 140/84, pulse 60, height  (1.6 m), weight 252 lb (114.3 kg), SpO2 97 %. Systems examined below  as of 09/08/17   General: No apparent distress alert and oriented x3 mood and affect normal, dressed appropriately.  HEENT: Pupils equal, extraocular movements intact  Respiratory: Patient's speak in full sentences and does not appear short of breath  Cardiovascular: No lower extremity edema, non tender, no erythema  Skin: Warm dry intact with no signs of infection or rash on extremities or on axial skeleton.  Abdomen: Soft nontender  Neuro: Cranial nerves II through XII are intact, neurovascularly intact in all extremities with 2+ DTRs and 2+ pulses.  Lymph: No lymphadenopathy of posterior or anterior cervical chain or axillae bilaterally.  Gait normal with good balance and coordination.  MSK:  Non tender with full  range of motion and good stability and symmetric strength and tone of shoulders, elbows,  Hip and ankles bilaterally.  Knee: Left valgus deformity noted. Large thigh to calf ratio.  Mild effusion noted Tender to palpation over medial and PF joint line.  ROM full in flexion and extension and lower leg rotation. instability with valgus force.  painful patellar compression. Patellar glide with moderate crepitus. Patellar and quadriceps tendons unremarkable. Hamstring and quadriceps strength is normal. Contralateral knee shows mild arthritic changes  Wrist: Right Inspection normal with no visible erythema or swelling. ROM smooth and normal with good flexion and extension and ulnar/radial deviation that is symmetrical with opposite wrist. Palpation is normal over metacarpals, navicular, lunate, and TFCC; tendons without tenderness/ swelling No snuffbox tenderness. No tenderness over Canal of Guyon. Strength 5/5 in all directions without pain. Positive Finkelstein, negative Tinel's and phalens. Negative Watson's test. Contralateral wrist unremarkable  After informed written and verbal consent, patient was seated on exam table. Left knee was prepped with alcohol swab and utilizing anterolateral approach, patient's left knee space was injected with 4:1  marcaine 0.5%: Kenalog /dL. Patient tolerated the procedure well without immediate complications.  Procedure: Real-time Ultrasound Guided Injection of right Abductor pollicis longs tendon sheath Device: GE Logiq E  Ultrasound guided injection is preferred based studies that show increased duration, increased effect, greater accuracy, decreased procedural pain, increased response rate with ultrasound guided versus blind injection.  Verbal informed consent obtained.  Time-out conducted.  Noted no overlying erythema, induration, or other signs of local infection.  Skin prepped in a sterile fashion.  Local anesthesia: Topical Ethyl chloride.    With sterile technique and under real time ultrasound guidance:  tendon visualized.  23g 5/8 inch needle inserted distal to proximal approach into tendon sheath. Pictures taken  for needle placement. Patient did have injection of 0.5 cc of 0.5% Marcaine, and 0.5 cc of Kenalog 40 mg/dL. Completed without difficulty  Pain immediately resolved suggesting accurate placement of the medication.  Advised to call if fevers/chills, erythema, induration, drainage, or persistent bleeding.  Images permanently stored and available for review in the ultrasound unit.  Impression: Technically successful ultrasound guided injection.     Impression and Recommendations:     This case required medical decision making of moderate complexity.      Note: This dictation was prepared with Dragon dictation along with smaller phrase technology. Any transcriptional errors that result from this process are unintentional.

## 2017-09-08 ENCOUNTER — Encounter: Payer: Self-pay | Admitting: Family Medicine

## 2017-09-08 ENCOUNTER — Ambulatory Visit: Payer: Managed Care, Other (non HMO) | Admitting: Family Medicine

## 2017-09-08 ENCOUNTER — Ambulatory Visit: Payer: Self-pay

## 2017-09-08 VITALS — BP 140/84 | HR 60 | Ht 63.0 in | Wt 252.0 lb

## 2017-09-08 DIAGNOSIS — M79641 Pain in right hand: Secondary | ICD-10-CM

## 2017-09-08 DIAGNOSIS — M1712 Unilateral primary osteoarthritis, left knee: Secondary | ICD-10-CM

## 2017-09-08 DIAGNOSIS — M654 Radial styloid tenosynovitis [de Quervain]: Secondary | ICD-10-CM

## 2017-09-08 NOTE — Assessment & Plan Note (Signed)
Given injection today.  Discussed icing regimen and home exercise.  Discussed which activities doing which wants to avoid.  Increase activity as tolerated.  Follow-up again in 4 to 8 weeks

## 2017-09-08 NOTE — Assessment & Plan Note (Signed)
Patient given an injection today.  Tolerated procedure well.  Discussed icing regimen and home exercises.  Discussed which activities to do which wants to avoid.  Patient was to increase activity as tolerated.  Follow-up again in 4 to 8 weeks declined formal physical therapy at this time.

## 2017-09-08 NOTE — Patient Instructions (Signed)
Good to see you  Holly Todd is your friend.  Stay active  Start doing the exercises more regularly.  Injected both the knee and the wrist today  See me again In4-6 weeks and we will make sure you are doing better

## 2017-10-06 ENCOUNTER — Telehealth: Payer: Self-pay | Admitting: Family Medicine

## 2017-10-06 NOTE — Telephone Encounter (Signed)
FYI: Patient canceled her appointment for June 27, she has moved it to July 22. She wanted to let Dr.Smith knows her hand is much better, but her knee is not doing well.

## 2017-10-08 ENCOUNTER — Ambulatory Visit: Payer: Self-pay | Admitting: Family Medicine

## 2017-10-30 NOTE — Progress Notes (Signed)
Tawana ScaleZach Smith D.O. [facility] [address] [place], [place] [address] Phone: [phone](336) [phone] Subjective:    I'  CC: Knee and wrist pain follow-up  UXL:[id number]:Subjective  Holly FlavorsLinda M Todd is a 47 y.o. female coming in with complaint of knee and wrist pain. Was given injections in both joints at last visit. Patient states that her hand pain has gone away. Patient states that her knee pain went away for a few days but then came back. Feels the same as before. No worse. Pain is still over patellar tendon. Patient notices swelling when she is on her feet a lot. Continues to use ice and IBU.       Past Medical History:  Diagnosis Date  . Asthma   . Heart murmur   . Hypertension    Past Surgical History:  Procedure Laterality Date  . CESAREAN SECTION    . CLOSED REDUCTION NASAL FRACTURE N/A 07/20/2015   Procedure: CLOSED REDUCTION NASAL FRACTURE;  Surgeon: Bud Facereighton Vaught, MD;  Location: [facility] ORS;  Service: ENT;  Laterality: N/A;  . TUBAL LIGATION     Social History   Socioeconomic History  . Marital status: Married    Spouse name: Not on file  . Number of children: Not on file  . Years of education: Not on file  . Highest education level: Not on file  Occupational History  . Not on file  Social Needs  . Financial resource strain: Not on file  . Food insecurity:    Worry: Not on file    Inability: Not on file  . Transportation needs:    Medical: Not on file    Non-medical: Not on file  Tobacco Use  . Smoking status: Never Smoker  . Smokeless tobacco: Never Used  Substance and Sexual Activity  . Alcohol use: No    Alcohol/week: 0.0 oz  . Drug use: No  . Sexual activity: Not on file  Lifestyle  . Physical activity:    Days per week: Not on file    Minutes per session: Not on file  . Stress: Not on file  Relationships  . Social connections:    Talks on phone: Not on file    Gets together: Not on file    Attends religious service: Not on file    Active member  of club or organization: Not on file    Attends meetings of clubs or organizations: Not on file    Relationship status: Not on file  Other Topics Concern  . Not on file  Social History Narrative  . Not on file   Allergies  Allergen Reactions  . Norvasc [Amlodipine Besylate] Palpitations    Chest pain   No family history on file.  No family history of autoimmune   Past medical history, social, surgical and family history all reviewed in electronic medical record.  No pertanent information unless stated regarding to the chief complaint.   Review of Systems:Review of systems updated and as accurate as of 11/02/17  No headache, visual changes, nausea, vomiting, diarrhea, constipation, dizziness, abdominal pain, skin rash, fevers, chills, night sweats, weight loss, swollen lymph nodes, body aches, joint swelling, muscle aches, chest pain, shortness of breath, mood changes.   Objective  Blood pressure 110/62, pulse (!) 58, height 5\' 3"  (1.6 m), weight 256 lb (116.1 kg), SpO2 98 %. Systems examined below as of 11/02/17   General: No apparent distress alert and oriented x3 mood and affect normal, dressed appropriately.  HEENT: Pupils equal,  extraocular movements intact  Respiratory: Patient's speak in full sentences and does not appear short of breath  Cardiovascular: No lower extremity edema, non tender, no erythema  Skin: Warm dry intact with no signs of infection or rash on extremities or on axial skeleton.  Abdomen: Soft nontender  Neuro: Cranial nerves II through XII are intact, neurovascularly intact in all extremities with 2+ DTRs and 2+ pulses.  Lymph: No lymphadenopathy of posterior or anterior cervical chain or axillae bilaterally.  Gait normal with good balance and coordination.  MSK:  Non tender with full range of motion and good stability and symmetric strength and tone of shoulders, elbows,, hip and ankles bilaterally. Knee: Left Normal to inspection with no erythema or  effusion or obvious bony abnormalities. Tenderness shows more tenderness now over the peds anserine area than truly the patellofemoral joint ROM full in flexion and extension and lower leg rotation. Ligaments with solid consistent endpoints including ACL, PCL, LCL, MCL. Negative Mcmurray's, Apley's, and Thessalonian tests. painful patellar compression. Patellar glide mild crepitus. Patellar and quadriceps tendons unremarkable. Hamstring and quadriceps strength is normal. Contralateral knee unremarkable      Impression and Recommendations:     This case required medical decision making of moderate complexity.      Note: This dictation was prepared with Dragon dictation along with smaller phrase technology. Any transcriptional errors that result from this process are unintentional.

## 2017-11-02 ENCOUNTER — Encounter: Payer: Self-pay | Admitting: Family Medicine

## 2017-11-02 ENCOUNTER — Ambulatory Visit: Payer: Managed Care, Other (non HMO) | Admitting: Family Medicine

## 2017-11-02 DIAGNOSIS — M7050 Other bursitis of knee, unspecified knee: Secondary | ICD-10-CM | POA: Diagnosis not present

## 2017-11-02 DIAGNOSIS — M1712 Unilateral primary osteoarthritis, left knee: Secondary | ICD-10-CM | POA: Diagnosis not present

## 2017-11-02 DIAGNOSIS — M654 Radial styloid tenosynovitis [de Quervain]: Secondary | ICD-10-CM | POA: Diagnosis not present

## 2017-11-02 NOTE — Assessment & Plan Note (Signed)
Stable.  No significant changes.  Continue bracing with a lot of activity

## 2017-11-02 NOTE — Patient Instructions (Signed)
Good to see you  Holly Todd is your friend.  Lets try a thigh compression sleeve with activity  New exercises for the hamstring today as well  See me again in 5-6 weeks

## 2017-11-02 NOTE — Assessment & Plan Note (Signed)
Resolved 100%.  Continue conservative therapy

## 2017-11-02 NOTE — Assessment & Plan Note (Signed)
Moderate bursitis.  Discussed topical anti-inflammatories.  Hamstring strengthening with eccentric.  Work with Event organiserathletic trainer.  Discussed which activities to doing which wants to avoid.  Follow-up again in 4 weeks

## 2017-12-21 ENCOUNTER — Ambulatory Visit: Payer: Self-pay | Admitting: Family Medicine

## 2018-02-19 ENCOUNTER — Other Ambulatory Visit: Payer: Self-pay

## 2018-02-19 DIAGNOSIS — E559 Vitamin D deficiency, unspecified: Secondary | ICD-10-CM

## 2018-02-19 DIAGNOSIS — E669 Obesity, unspecified: Secondary | ICD-10-CM

## 2018-02-19 DIAGNOSIS — I10 Essential (primary) hypertension: Secondary | ICD-10-CM

## 2018-02-19 DIAGNOSIS — E782 Mixed hyperlipidemia: Secondary | ICD-10-CM

## 2018-02-20 LAB — COMPREHENSIVE METABOLIC PANEL
A/G RATIO: 1.6 (ref 1.2–2.2)
ALBUMIN: 3.9 g/dL (ref 3.5–5.5)
ALT: 19 IU/L (ref 0–32)
AST: 18 IU/L (ref 0–40)
Alkaline Phosphatase: 54 IU/L (ref 39–117)
BUN/Creatinine Ratio: 24 — ABNORMAL HIGH (ref 9–23)
BUN: 21 mg/dL (ref 6–24)
Bilirubin Total: 0.3 mg/dL (ref 0.0–1.2)
CALCIUM: 9.6 mg/dL (ref 8.7–10.2)
CO2: 21 mmol/L (ref 20–29)
Chloride: 103 mmol/L (ref 96–106)
Creatinine, Ser: 0.88 mg/dL (ref 0.57–1.00)
GFR, EST AFRICAN AMERICAN: 90 mL/min/{1.73_m2} (ref >59)
GFR, EST NON AFRICAN AMERICAN: 78 mL/min/{1.73_m2} (ref >59)
GLOBULIN, TOTAL: 2.4 g/dL (ref 1.5–4.5)
Glucose: 99 mg/dL (ref 65–99)
POTASSIUM: 4.5 mmol/L (ref 3.5–5.2)
SODIUM: 141 mmol/L (ref 134–144)
TOTAL PROTEIN: 6.3 g/dL (ref 6.0–8.5)

## 2018-02-20 LAB — VITAMIN D 25 HYDROXY (VIT D DEFICIENCY, FRACTURES): VIT D 25 HYDROXY: 30.3 ng/mL (ref 30.0–100.0)

## 2018-02-20 LAB — LIPID PANEL
CHOL/HDL RATIO: 6.5 ratio — AB (ref 0.0–4.4)
Cholesterol, Total: 229 mg/dL — ABNORMAL HIGH (ref 100–199)
HDL: 35 mg/dL — ABNORMAL LOW (ref >39)
LDL CALC: 146 mg/dL — AB (ref 0–99)
Triglycerides: 238 mg/dL — ABNORMAL HIGH (ref 0–149)
VLDL Cholesterol Cal: 48 mg/dL — ABNORMAL HIGH (ref 5–40)

## 2018-02-20 LAB — TSH: TSH: 2.51 u[IU]/mL (ref 0.450–4.500)

## 2018-02-20 LAB — HGB A1C W/O EAG: Hgb A1c MFr Bld: 6 % — ABNORMAL HIGH (ref 4.8–5.6)

## 2018-02-22 NOTE — Progress Notes (Signed)
Lab results from 02/19/18 have been routed to the ordering Provider Orson Eva NP 02/22/18 Erskine Squibb Skye Plamondon CMA

## 2018-03-17 ENCOUNTER — Encounter: Payer: Self-pay | Admitting: Emergency Medicine

## 2018-03-17 ENCOUNTER — Ambulatory Visit: Payer: Self-pay | Admitting: Emergency Medicine

## 2018-03-17 VITALS — BP 118/78 | HR 67 | Temp 98.2°F | Resp 14

## 2018-03-17 DIAGNOSIS — J0100 Acute maxillary sinusitis, unspecified: Secondary | ICD-10-CM

## 2018-03-17 MED ORDER — AMOXICILLIN 875 MG PO TABS: ORAL_TABLET | ORAL | 0 refills | Status: DC

## 2018-03-17 NOTE — Progress Notes (Signed)
  Subjective:     Patient ID: Holly Todd, female   DOB: 12/14/1970, 47 y.o.   MRN: [id number]  HPI   Onset: 14 days Facial/sinus pressure with discolored nasal mucus.    Severity: moderate Tried OTC meds without significant relief.  Symptoms:  + Fever  + URI prodrome with nasal congestion + Minimal swollen neck glands + mild Sinus Headache + mild ear pressure  Mild flareup of seasonal allergy symptoms No significant Sore Throat No eye symptoms     No significant Cough No chest pain No shortness of breath  No wheezing  No Abdominal Pain No Nausea No Vomiting No diarrhea  No Myalgias No focal neurologic symptoms No syncope No Rash  No Urinary symptoms  Remainder of Review of Systems negative except as noted in the HPI.   Review of Systems     Objective:   Physical Exam  Constitutional: She is oriented to person, place, and time. She appears well-developed and well-nourished. No distress.  HENT:  Head: Normocephalic and atraumatic.  Right Ear: Tympanic membrane, external ear and ear canal normal.  Left Ear: Tympanic membrane, external ear and ear canal normal.  Nose: Mucosal edema and rhinorrhea present. Right sinus exhibits maxillary sinus tenderness. Left sinus exhibits maxillary sinus tenderness.  Mouth/Throat: Oropharynx is clear and moist. No oral lesions. No oropharyngeal exudate.  Eyes: Right eye exhibits no discharge. Left eye exhibits no discharge. No scleral icterus.  Neck: Neck supple.  Cardiovascular: Normal rate, regular rhythm and normal heart sounds.  Pulmonary/Chest: Effort normal and breath sounds normal. She has no wheezes. She has no rales.  Lymphadenopathy:    She has no cervical adenopathy.  Neurological: She is alert and oriented to person, place, and time.  Skin: Skin is warm and dry.  Nursing note and vitals reviewed.      Assessment:     Acute maxillary sinusitis. May have an element of allergic rhinitis.    Plan:     Treatment options discussed, as well as risks, benefits, alternatives. Patient voiced understanding and agreement with the following plans: New Prescriptions   AMOXICILLIN (AMOXIL) 875 MG TABLET    Take 1 twice a day X 10 days.  OTC Flonase and other symptomatic care Follow-up with your primary care doctor in 5-7 days if not improving, or sooner if symptoms become worse. Precautions discussed. Red flags discussed. Questions invited and answered. Patient voiced understanding and agreement.

## 2018-05-26 ENCOUNTER — Other Ambulatory Visit: Payer: Self-pay

## 2018-05-26 ENCOUNTER — Ambulatory Visit: Payer: Self-pay | Admitting: Adult Health

## 2018-05-26 VITALS — BP 122/82 | HR 60 | Temp 98.0°F | Resp 12

## 2018-05-26 DIAGNOSIS — J039 Acute tonsillitis, unspecified: Principal | ICD-10-CM

## 2018-05-26 DIAGNOSIS — J029 Acute pharyngitis, unspecified: Secondary | ICD-10-CM

## 2018-05-26 LAB — POCT RAPID STREP A (OFFICE): Rapid Strep A Screen: NEGATIVE

## 2018-05-26 MED ORDER — CLINDAMYCIN HCL 300 MG PO CAPS: 300.0000 mg | ORAL_CAPSULE | Freq: Three times a day (TID) | ORAL | 0 refills | Status: DC

## 2018-05-26 MED ORDER — PREDNISONE 10 MG (21) PO TBPK: ORAL_TABLET | ORAL | 0 refills | Status: DC

## 2018-05-26 NOTE — Progress Notes (Signed)
[facility]  Subjective:     Patient ID: Holly Todd, female   DOB: 02-20-1971, 48 y.o.   MRN: [id number]  HPI   Blood pressure 122/82, pulse 60, temperature 98 F (36.7 C), temperature source Oral, resp. rate 12, SpO2 100 %. Patient is a 48 year old female in no acute distress who comes to the clinic for concerns of tonsil swelling, sinus congestion, cold symptoms. She had cold symptoms over one week ago. Cold symptoms have improved since that time.   03/18/19 saw Dr. Georgina PillionMassey for acute maxillary sinusitis  Treated with Amoxicillin 875 mg twice daily x 10 days she reports she does not think her symptoms ever went away though she reports cold symptoms have improved, tonsils remain painful and " swollen" She denies any changes in her voice. She has painful swallowing but is able to swallow foods and liquids without difficulty.  Denies any choking.   Patient  denies any fever, body aches,chills, rash, chest pain, shortness of breath, nausea, vomiting, or diarrhea.   Allergies  Allergen Reactions  . Norvasc [Amlodipine Besylate] Palpitations    Chest pain    Patient Active Problem List   Diagnosis Date Noted  . Pes anserine bursitis 11/02/2017  . Patellofemoral arthritis of left knee 08/04/2017  . De Quervain's tenosynovitis, right 08/04/2017    Current Outpatient Medications:   .  lisinopril (PRINIVIL,ZESTRIL) 40 MG tablet, Take 40 mg by mouth every morning., Disp: , Rfl:  .  nebivolol (BYSTOLIC) 10 MG tablet, Take 10 mg by mouth daily., Disp: , Rfl:  .  spironolactone-hydrochlorothiazide (ALDACTAZIDE) 25-25 MG tablet, Take 1 tablet by mouth daily., Disp: , Rfl: 0 Review of Systems  Constitutional: Positive for fatigue (mild ). Negative for activity change, appetite change, chills, diaphoresis, fever and unexpected weight change.  HENT: Positive for rhinorrhea and sore throat. Negative for congestion, dental problem, drooling, ear  discharge, ear pain, facial swelling, hearing loss, mouth sores, nosebleeds, postnasal drip, sinus pressure, sinus pain, sneezing, tinnitus, trouble swallowing and voice change.   Respiratory: Negative.   Cardiovascular: Negative.   Gastrointestinal: Negative.   Genitourinary: Negative.   Musculoskeletal: Negative.   Skin: Negative.   Allergic/Immunologic:        -- Norvasc (Amlodipine Besylate) -- Palpitations   --  Chest pain   Neurological: Negative.   Hematological: Negative.   Psychiatric/Behavioral: Negative.        Objective:   Physical Exam Constitutional:      General: She is not in acute distress.    Appearance: She is well-developed. She is obese. She is not ill-appearing, toxic-appearing or diaphoretic.  HENT:     Head: Normocephalic and atraumatic.     Jaw: There is normal jaw occlusion.     Salivary Glands: Right salivary gland is not diffusely enlarged or tender. Left salivary gland is not diffusely enlarged or tender.     Right Ear: Ear canal normal. No drainage, swelling or tenderness. A middle ear effusion is present. Tympanic membrane is not erythematous or bulging.     Left Ear: Ear canal normal. No drainage, swelling or tenderness. A middle ear effusion is present. Tympanic membrane is not erythematous or bulging.     Nose: Congestion and rhinorrhea present.     Right Sinus: No maxillary sinus tenderness or frontal sinus tenderness.     Left Sinus: No maxillary sinus tenderness or frontal sinus tenderness.     Mouth/Throat:     Lips: Pink.  Mouth: Mucous membranes are moist. No oral lesions.     Dentition: Gingival swelling: No stridor      Pharynx: Uvula midline. Posterior oropharyngeal erythema present. No pharyngeal swelling, oropharyngeal exudate or uvula swelling.     Tonsils: Tonsillar exudate (white exudate bilateral ) present. No tonsillar abscesses. Swelling: 2+ on the right. 1+ on the left.     Comments: Oral airway patent No stridor.  Eyes:      Conjunctiva/sclera: Conjunctivae normal.     Pupils: Pupils are equal, round, and reactive to light.  Neck:     Musculoskeletal: Full passive range of motion without pain, normal range of motion and neck supple.     Trachea: Trachea and phonation normal.  Cardiovascular:     Rate and Rhythm: Normal rate and regular rhythm.     Heart sounds: Normal heart sounds. No murmur. No friction rub. No gallop.   Pulmonary:     Effort: Pulmonary effort is normal. No respiratory distress.     Breath sounds: Normal breath sounds. No stridor. No wheezing, rhonchi or rales.  Chest:     Chest wall: No tenderness.  Abdominal:     Palpations: Abdomen is soft.  Lymphadenopathy:     Cervical: Cervical adenopathy present.     Right cervical: Superficial cervical adenopathy (mild bilateral soft mobile mildly tender right worse than left all less than 1cm.) present.     Left cervical: Superficial cervical adenopathy present.  Skin:    General: Skin is warm and dry.     Capillary Refill: Capillary refill takes less than 2 seconds.     Coloration: Skin is not pale.     Findings: No erythema or rash.  Neurological:     General: No focal deficit present.     Mental Status: She is alert and oriented to person, place, and time.  Psychiatric:        Mood and Affect: Mood normal.        Behavior: Behavior normal.        Assessment:   Tonsillopharyngitis  Sore throat - Plan: Upper Respiratory Culture, Routine, POCT Rapid Strep A-Office (CPT [phone])       Plan:     Meds ordered this encounter  Medications  . clindamycin (CLEOCIN) 300 MG capsule    Sig: Take 1 capsule (300 mg total) by mouth 3 (three) times daily.    Dispense:  30 capsule    Refill:  0  . predniSONE (STERAPRED UNI-PAK 21 TAB) 10 MG (21) TBPK tablet    Sig: PO: Take 6 tablets on day 1:Take 5 tablets day 2:Take 4 tablets day 3: Take 3 tablets day 4:Take 2 tablets day five: 5 Take 1 tablet day 6    Dispense:  21 tablet    Refill:  0    Gave and reviewed After Visit Summary(AVS) with patient. Patient is advised to read the after visit summary as well and let the provider know if any question, concerns or clarifications are needed.   Advised patient call the office or your primary care doctor for an appointment if no improvement within 72 hours or if any symptoms change or worsen at any time  Advised ER or urgent Care if after hours or on weekend. Call 911 for emergency symptoms at any time.Patinet verbalized understanding of all instructions given/reviewed and treatment plan and has no further questions or concerns at this time.    Patient verbalized understanding of all instructions given and denies any further questions  at this time.

## 2018-05-26 NOTE — Patient Instructions (Signed)
Tonsillitis  Tonsillitis is an infection of the throat that causes the tonsils to become red, tender, and swollen. Tonsils are tissues in the back of your throat. Each tonsil has crevices (crypts). Tonsils normally work to protect the body from infection. What are the causes? Sudden (acute) tonsillitis may be caused by a virus or bacteria, including streptococcal bacteria. Long-lasting (chronic) tonsillitis occurs when the crypts of the tonsils become filled with pieces of food and bacteria, which makes it easy for the tonsils to become repeatedly infected. Tonsillitis can be spread from person to person (is contagious). It may be spread by inhaling droplets that are released with coughing or sneezing. You may also come into contact with viruses or bacteria on surfaces, such as cups or utensils. What are the signs or symptoms? Symptoms of this condition include:  A sore throat. This may include trouble swallowing.  White patches on the tonsils.  Swollen tonsils.  Fever.  Headache.  Tiredness.  Loss of appetite.  Snoring during sleep when you did not snore before.  Small, foul-smelling, yellowish-white pieces of material (tonsilloliths) that you occasionally cough up or spit out. These can cause you to have bad breath. How is this diagnosed? This condition is diagnosed with a physical exam. Diagnosis can be confirmed with the results of lab tests, including a throat culture. How is this treated? Treatment for this condition depends on the cause, but usually focuses on treating the symptoms associated with it. Treatment may include:  Medicines to relieve pain and manage fever.  Steroid medicines to reduce swelling.  Antibiotic medicines if the condition is caused by bacteria. If attacks of tonsillitis are severe and frequent, your health care provider may recommend surgery to remove the tonsils (tonsillectomy). Follow these instructions at home: Medicines  Take over-the-counter  and prescription medicines only as told by your health care provider.  If you were prescribed an antibiotic medicine, take it as told by your health care provider. Do not stop taking the antibiotic even if you start to feel better. Eating and drinking  Drink enough fluid to keep your urine clear or pale yellow.  While your throat is sore, eat soft or liquid foods, such as sherbet, soups, or instant breakfast drinks.  Drink warm liquids.  Eat frozen ice pops. General instructions  Rest as much as possible and get plenty of sleep.  Gargle with a salt-water mixture 3-4 times a day or as needed. To make a salt-water mixture, completely dissolve -1 tsp of salt in 1 cup of warm water.  Wash your hands regularly with soap and water. If soap and water are not available, use hand sanitizer.  Do not share cups, bottles, or other utensils until your symptoms have gone away.  Do not smoke. This can help your symptoms and prevent the infection from coming back. If you need help quitting, ask your health care provider.  Keep all follow-up visits as told by your health care provider. This is important. Contact a health care provider if:  You notice large, tender lumps in your neck that were not there before.  You have a fever that does not go away after 2-3 days.  You develop a rash.  You cough up a green, yellow-brown, or bloody substance.  You cannot swallow liquids or food for 24 hours.  Only one of your tonsils is swollen. Get help right away if:  You develop any new symptoms, such as vomiting, severe headache, stiff neck, chest pain, trouble breathing, or trouble  swallowing.  You have severe throat pain along with drooling or voice changes.  You have severe pain that is not controlled with medicines.  You cannot fully open your mouth.  You develop redness, swelling, or severe pain anywhere in your neck. Summary  Tonsillitis is an infection of the throat that causes the  tonsils to become red, tender, and swollen.  Tonsillitis may be caused by a virus or bacteria.  Rest as much as possible. Get plenty of sleep. This information is not intended to replace advice given to you by your health care provider. Make sure you discuss any questions you have with your health care provider. Document Released: 01/08/2005 Document Revised: 05/06/2016 Document Reviewed: 05/06/2016 Elsevier Interactive Patient Education  2019 [employer] Prednisolone tablets What is this medicine? PREDNISOLONE (pred NISS oh lone) is a corticosteroid. It is commonly used to treat inflammation of the skin, joints, lungs, and other organs. Common conditions treated include asthma, allergies, and arthritis. It is also used for other conditions, such as blood disorders and diseases of the adrenal glands. This medicine may be used for other purposes; ask your health care provider or pharmacist if you have questions. COMMON BRAND NAME(S): Millipred, Millipred DP, Millipred DP 12-Day, Millipred DP 6 Day, Prednoral What should I tell my health care provider before I take this medicine? They need to know if you have any of these conditions: -Cushing's syndrome -diabetes -glaucoma -heart problems or disease -high blood pressure -infection such as herpes, measles, tuberculosis, or chickenpox -kidney disease -liver disease -mental problems -myasthenia gravis -osteoporosis -seizures -stomach ulcer or intestine disease including colitis and diverticulitis -thyroid problem -an unusual or allergic reaction to lactose, prednisolone, other medicines, foods, dyes, or preservatives -pregnant or trying to get pregnant -breast-feeding How should I use this medicine? Take this medicine by mouth with a glass of water. Follow the directions on the prescription label. Take it with food or milk to avoid stomach upset. If you are taking this medicine once a day, take it in the morning. Do not take more  medicine than you are told to take. Do not suddenly stop taking your medicine because you may develop a severe reaction. Your doctor will tell you how much medicine to take. If your doctor wants you to stop the medicine, the dose may be slowly lowered over time to avoid any side effects. Talk to your pediatrician regarding the use of this medicine in children. Special care may be needed. Overdosage: If you think you have taken too much of this medicine contact a poison control center or emergency room at once. NOTE: This medicine is only for you. Do not share this medicine with others. What if I miss a dose? If you miss a dose, take it as soon as you can. If it is almost time for your next dose, take only that dose. Do not take double or extra doses. What may interact with this medicine? Do not take this medicine with any of the following medications: -metyrapone -mifepristone This medicine may also interact with the following medications: -aminoglutethimide -amphotericin B -aspirin and aspirin-like medicines -barbiturates -certain medicines for diabetes, like glipizide or glyburide -cholestyramine -cholinesterase inhibitors -cyclosporine -digoxin -diuretics -ephedrine -female hormones, like estrogens and birth control pills -isoniazid -ketoconazole -NSAIDS, medicines for pain and inflammation, like ibuprofen or naproxen -phenytoin -rifampin -toxoids -vaccines -warfarin This list may not describe all possible interactions. Give your health care provider a list of all the medicines, herbs, non-prescription drugs, or dietary supplements you use.  Also tell them if you smoke, drink alcohol, or use illegal drugs. Some items may interact with your medicine. What should I watch for while using this medicine? Visit your doctor or health care professional for regular checks on your progress. If you are taking this medicine over a prolonged period, carry an identification card with your name  and address, the type and dose of your medicine, and your doctor's name and address. This medicine may increase your risk of getting an infection. Tell your doctor or health care professional if you are around anyone with measles or chickenpox, or if you develop sores or blisters that do not heal properly. If you are going to have surgery, tell your doctor or health care professional that you have taken this medicine within the last twelve months. Ask your doctor or health care professional about your diet. You may need to lower the amount of salt you eat. This medicine may affect blood sugar levels. If you have diabetes, check with your doctor or health care professional before you change your diet or the dose of your diabetic medicine. What side effects may I notice from receiving this medicine? Side effects that you should report to your doctor or health care professional as soon as possible: -allergic reactions like skin rash, itching or hives, swelling of the face, lips, or tongue -changes in emotions or moods -changes in vision -eye pain -signs and symptoms of high blood sugar such as dizziness; dry mouth; dry skin; fruity breath; nausea; stomach pain; increased hunger or thirst; increased urination -signs and symptoms of infection like fever or chills; cough; sore throat; pain or trouble passing urine -slow growth in children (if used for longer periods of time) -swelling of ankles, feet -trouble sleeping -unusually weak or tired -weak bones (if used for longer periods of time) Side effects that usually do not require medical attention (report to your doctor or health care professional if they continue or are bothersome): -increased hunger -nausea -skin problems, acne, thin and shiny skin -upset stomach -weight gain This list may not describe all possible side effects. Call your doctor for medical advice about side effects. You may report side effects to FDA at 1-800-FDA-1088. Where  should I keep my medicine? Keep out of the reach of children. Store at room temperature between 15 and 30 degrees C (59 and 86 degrees F). Keep container tightly closed. Throw away any unused medicine after the expiration date. NOTE: This sheet is a summary. It may not cover all possible information. If you have questions about this medicine, talk to your doctor, pharmacist, or health care provider.  2019 Elsevier/Gold Standard (2015-05-03 12:30:30) Clindamycin capsules What is this medicine? CLINDAMYCIN (KLIN da MYE sin) is a lincosamide antibiotic. It is used to treat certain kinds of bacterial infections. It will not work for colds, flu, or other viral infections. This medicine may be used for other purposes; ask your health care provider or pharmacist if you have questions. COMMON BRAND NAME(S): Cleocin What should I tell my health care provider before I take this medicine? They need to know if you have any of these conditions: -kidney disease -liver disease -stomach problems like colitis -an unusual or allergic reaction to clindamycin, lincomycin, or other medicines, foods, dyes like tartrazine or preservatives -pregnant or trying to get pregnant -breast-feeding How should I use this medicine? Take this medicine by mouth with a full glass of water. Follow the directions on the prescription label. You can take this medicine with food  or on an empty stomach. If the medicine upsets your stomach, take it with food. Take your medicine at regular intervals. Do not take your medicine more often than directed. Take all of your medicine as directed even if you think your are better. Do not skip doses or stop your medicine early. Talk to your pediatrician regarding the use of this medicine in children. Special care may be needed. Overdosage: If you think you have taken too much of this medicine contact a poison control center or emergency room at once. NOTE: This medicine is only for you. Do not  share this medicine with others. What if I miss a dose? If you miss a dose, take it as soon as you can. If it is almost time for your next dose, take only that dose. Do not take double or extra doses. What may interact with this medicine? -birth control pills -erythromycin -medicines that relax muscles for surgery -rifampin This list may not describe all possible interactions. Give your health care provider a list of all the medicines, herbs, non-prescription drugs, or dietary supplements you use. Also tell them if you smoke, drink alcohol, or use illegal drugs. Some items may interact with your medicine. What should I watch for while using this medicine? Tell your doctor or healthcare professional if your symptoms do not start to get better or if they get worse. Do not treat diarrhea with over the counter products. Contact your doctor if you have diarrhea that lasts more than 2 days or if it is severe and watery. What side effects may I notice from receiving this medicine? Side effects that you should report to your doctor or health care professional as soon as possible: -allergic reactions like skin rash, itching or hives, swelling of the face, lips, or tongue -dark urine -pain on swallowing -redness, blistering, peeling or loosening of the skin, including inside the mouth -unusual bleeding or bruising -unusually weak or tired -yellowing of eyes or skin Side effects that usually do not require medical attention (report to your doctor or health care professional if they continue or are bothersome): -diarrhea -itching in the rectal or genital area -joint pain -nausea, vomiting -stomach pain This list may not describe all possible side effects. Call your doctor for medical advice about side effects. You may report side effects to FDA at 1-800-FDA-1088. Where should I keep my medicine? Keep out of the reach of children. Store at room temperature between 20 and 25 degrees C (68 and 77  degrees F). Throw away any unused medicine after the expiration date. NOTE: This sheet is a summary. It may not cover all possible information. If you have questions about this medicine, talk to your doctor, pharmacist, or health care provider.  2019 Elsevier/Gold Standard (2015-07-04 16:34:00)

## 2018-05-28 NOTE — Progress Notes (Signed)
Bretlyn-  could you let the patient know that she had heavy growth of routine normal flora, this is likely normal but given her symptoms she can continue antibiotic as prescribed and follow-up with office if she is no better in 3 to 5 days or if any symptoms worsen seek after-hours care if after hours. Thanks, Marvell Fuller MSN, AGNP-C, FNP-C

## 2018-05-29 LAB — UPPER RESPIRATORY CULTURE, ROUTINE

## 2018-09-03 ENCOUNTER — Encounter: Payer: Self-pay | Admitting: Adult Health

## 2018-09-03 ENCOUNTER — Other Ambulatory Visit: Payer: Managed Care, Other (non HMO)

## 2018-09-03 ENCOUNTER — Ambulatory Visit: Payer: Managed Care, Other (non HMO) | Admitting: Adult Health

## 2018-09-03 ENCOUNTER — Other Ambulatory Visit: Payer: Self-pay

## 2018-09-03 VITALS — BP 118/84 | HR 64 | Temp 98.5°F | Resp 16 | Ht 63.0 in | Wt 257.0 lb

## 2018-09-03 DIAGNOSIS — Z0189 Encounter for other specified special examinations: Secondary | ICD-10-CM | POA: Diagnosis not present

## 2018-09-03 DIAGNOSIS — Z008 Encounter for other general examination: Secondary | ICD-10-CM

## 2018-09-03 DIAGNOSIS — I10 Essential (primary) hypertension: Secondary | ICD-10-CM

## 2018-09-03 DIAGNOSIS — R7301 Impaired fasting glucose: Secondary | ICD-10-CM | POA: Diagnosis not present

## 2018-09-03 DIAGNOSIS — E785 Hyperlipidemia, unspecified: Secondary | ICD-10-CM | POA: Diagnosis not present

## 2018-09-03 DIAGNOSIS — R5383 Other fatigue: Secondary | ICD-10-CM

## 2018-09-03 NOTE — Addendum Note (Signed)
Addended by: Karen Kays on: 09/03/2018 09:30 AM   Modules accepted: Level of Service

## 2018-09-03 NOTE — Patient Instructions (Signed)
I will have the office call you on your glucose and cholesterol results when they return if you have not heard within 1 week please call the office.  This biometric physical is a brief physical and the only labs done are glucose and your lipid panel(cholesterol) and is  not a substitute for seeing a primary care provider for a complete annual physical. Please see a primary care physician for routine health maintenance, labs and full physical at least yearly and follow up as recommended by your provider. Provider also recommends if you do not have a primary care provider for patient to establish care as soon as possible .Patient may chose provider of choice. Also gave the [place] at [phone] or web site at [email or web address] to help assist with finding a primary care doctor.  Patient verbalizes understanding that his office is acute care only and not a substitute for a primary care or for the management of chronic conditions.   ,  Health Maintenance, Female Adopting a healthy lifestyle and getting preventive care can go a long way to promote health and wellness. Talk with your health care provider about what schedule of regular examinations is right for you. This is a good chance for you to check in with your provider about disease prevention and staying healthy. In between checkups, there are plenty of things you can do on your own. Experts have done a lot of research about which lifestyle changes and preventive measures are most likely to keep you healthy. Ask your health care provider for more information. Weight and diet Eat a healthy diet  Be sure to include plenty of vegetables, fruits, low-fat dairy products, and lean protein.  Do not eat a lot of foods high in solid fats, added sugars, or salt.  Get regular exercise. This is one of the most important things you can do for your health. ? Most adults should exercise for at least 150 minutes each  week. The exercise should increase your heart rate and make you sweat (moderate-intensity exercise). ? Most adults should also do strengthening exercises at least twice a week. This is in addition to the moderate-intensity exercise. Maintain a healthy weight  Body mass index (BMI) is a measurement that can be used to identify possible weight problems. It estimates body fat based on height and weight. Your health care provider can help determine your BMI and help you achieve or maintain a healthy weight.  For females 53 years of age and older: ? A BMI below 18.5 is considered underweight. ? A BMI of 18.5 to 24.9 is normal. ? A BMI of 25 to 29.9 is considered overweight. ? A BMI of 30 and above is considered obese. Watch levels of cholesterol and blood lipids  You should start having your blood tested for lipids and cholesterol at 49 years of age, then have this test every 5 years.  You may need to have your cholesterol levels checked more often if: ? Your lipid or cholesterol levels are high. ? You are older than 49 years of age. ? You are at high risk for heart disease. Cancer screening Lung Cancer  Lung cancer screening is recommended for adults 64-81 years old who are at high risk for lung cancer because of a history of smoking.  A yearly low-dose CT scan of the lungs is recommended for people who: ? Currently smoke. ? Have quit within the past 15 years. ? Have at least a 30-pack-year  30-pack-year history of smoking. A pack year is smoking an average of one pack of cigarettes a day for 1 year.  Yearly screening should continue until it has been 15 years since you quit.  Yearly screening should stop if you develop a health problem that would prevent you from having lung cancer treatment. Breast Cancer  Practice breast self-awareness. This means understanding how your breasts normally appear and feel.  It also means doing regular breast self-exams. Let your health care provider know about any  changes, no matter how small.  If you are in your 20s or 30s, you should have a clinical breast exam (CBE) by a health care provider every 1-3 years as part of a regular health exam.  If you are 40 or older, have a CBE every year. Also consider having a breast X-ray (mammogram) every year.  If you have a family history of breast cancer, talk to your health care provider about genetic screening.  If you are at high risk for breast cancer, talk to your health care provider about having an MRI and a mammogram every year.  Breast cancer gene (BRCA) assessment is recommended for women who have family members with BRCA-related cancers. BRCA-related cancers include: ? Breast. ? Ovarian. ? Tubal. ? Peritoneal cancers.  Results of the assessment will determine the need for genetic counseling and BRCA1 and BRCA2 testing. Cervical Cancer Your health care provider may recommend that you be screened regularly for cancer of the pelvic organs (ovaries, uterus, and vagina). This screening involves a pelvic examination, including checking for microscopic changes to the surface of your cervix (Pap test). You may be encouraged to have this screening done every 3 years, beginning at age 21.  For women ages 30-65, health care providers may recommend pelvic exams and Pap testing every 3 years, or they may recommend the Pap and pelvic exam, combined with testing for human papilloma virus (HPV), every 5 years. Some types of HPV increase your risk of cervical cancer. Testing for HPV may also be done on women of any age with unclear Pap test results.  Other health care providers may not recommend any screening for nonpregnant women who are considered low risk for pelvic cancer and who do not have symptoms. Ask your health care provider if a screening pelvic exam is right for you.  If you have had past treatment for cervical cancer or a condition that could lead to cancer, you need Pap tests and screening for cancer  for at least 20 years after your treatment. If Pap tests have been discontinued, your risk factors (such as having a new sexual partner) need to be reassessed to determine if screening should resume. Some women have medical problems that increase the chance of getting cervical cancer. In these cases, your health care provider may recommend more frequent screening and Pap tests. Colorectal Cancer  This type of cancer can be detected and often prevented.  Routine colorectal cancer screening usually begins at 50 years of age and continues through 49 years of age.  Your health care provider may recommend screening at an earlier age if you have risk factors for colon cancer.  Your health care provider may also recommend using home test kits to check for hidden blood in the stool.  A small camera at the end of a tube can be used to examine your colon directly (sigmoidoscopy or colonoscopy). This is done to check for the earliest forms of colorectal cancer.  Routine screening usually begins at   50.  Direct examination of the colon should be repeated every 5-10 years through 49 years of age. However, you may need to be screened more often if early forms of precancerous polyps or small growths are found. Skin Cancer  Check your skin from head to toe regularly.  Tell your health care provider about any new moles or changes in moles, especially if there is a change in a mole's shape or color.  Also tell your health care provider if you have a mole that is larger than the size of a pencil eraser.  Always use sunscreen. Apply sunscreen liberally and repeatedly throughout the day.  Protect yourself by wearing long sleeves, pants, a wide-brimmed hat, and sunglasses whenever you are outside. Heart disease, diabetes, and high blood pressure  High blood pressure causes heart disease and increases the risk of stroke. High blood pressure is more likely to develop in: ? People who have blood pressure in  the high end of the normal range (130-139/85-89 mm Hg). ? People who are overweight or obese. ? People who are African American.  If you are 34-20 years of age, have your blood pressure checked every 3-5 years. If you are 19 years of age or older, have your blood pressure checked every year. You should have your blood pressure measured twice-once when you are at a hospital or clinic, and once when you are not at a hospital or clinic. Record the average of the two measurements. To check your blood pressure when you are not at a hospital or clinic, you can use: ? An automated blood pressure machine at a pharmacy. ? A home blood pressure monitor.  If you are between 37 years and 18 years old, ask your health care provider if you should take aspirin to prevent strokes.  Have regular diabetes screenings. This involves taking a blood sample to check your fasting blood sugar level. ? If you are at a normal weight and have a low risk for diabetes, have this test once every three years after 49 years of age. ? If you are overweight and have a high risk for diabetes, consider being tested at a younger age or more often. Preventing infection Hepatitis B  If you have a higher risk for hepatitis B, you should be screened for this virus. You are considered at high risk for hepatitis B if: ? You were born in a country where hepatitis B is common. Ask your health care provider which countries are considered high risk. ? Your parents were born in a high-risk country, and you have not been immunized against hepatitis B (hepatitis B vaccine). ? You have HIV or AIDS. ? You use needles to inject street drugs. ? You live with someone who has hepatitis B. ? You have had sex with someone who has hepatitis B. ? You get hemodialysis treatment. ? You take certain medicines for conditions, including cancer, organ transplantation, and autoimmune conditions. Hepatitis C  Blood testing is recommended for: ? Everyone  born from [birth year] through 1966. ? Anyone with known risk factors for hepatitis C. Sexually transmitted infections (STIs)  You should be screened for sexually transmitted infections (STIs) including gonorrhea and chlamydia if: ? You are sexually active and are younger than 49 years of age. ? You are older than 49 years of age and your health care provider tells you that you are at risk for this type of infection. ? Your sexual activity has changed since you were last screened and you are  at an increased risk for chlamydia or gonorrhea. Ask your health care provider if you are at risk.  If you do not have HIV, but are at risk, it may be recommended that you take a prescription medicine daily to prevent HIV infection. This is called pre-exposure prophylaxis (PrEP). You are considered at risk if: ? You are sexually active and do not regularly use condoms or know the HIV status of your partner(s). ? You take drugs by injection. ? You are sexually active with a partner who has HIV. Talk with your health care provider about whether you are at high risk of being infected with HIV. If you choose to begin PrEP, you should first be tested for HIV. You should then be tested every 3 months for as long as you are taking PrEP. Pregnancy  If you are premenopausal and you may become pregnant, ask your health care provider about preconception counseling.  If you may become pregnant, take 400 to 800 micrograms (mcg) of folic acid every day.  If you want to prevent pregnancy, talk to your health care provider about birth control (contraception). Osteoporosis and menopause  Osteoporosis is a disease in which the bones lose minerals and strength with aging. This can result in serious bone fractures. Your risk for osteoporosis can be identified using a bone density scan.  If you are 83 years of age or older, or if you are at risk for osteoporosis and fractures, ask your health care provider if you should be  screened.  Ask your health care provider whether you should take a calcium or vitamin D supplement to lower your risk for osteoporosis.  Menopause may have certain physical symptoms and risks.  Hormone replacement therapy may reduce some of these symptoms and risks. Talk to your health care provider about whether hormone replacement therapy is right for you. Follow these instructions at home:  Schedule regular health, dental, and eye exams.  Stay current with your immunizations.  Do not use any tobacco products including cigarettes, chewing tobacco, or electronic cigarettes.  If you are pregnant, do not drink alcohol.  If you are breastfeeding, limit how much and how often you drink alcohol.  Limit alcohol intake to no more than 1 drink per day for nonpregnant women. One drink equals 12 ounces of beer, 5 ounces of wine, or 1 ounces of hard liquor.  Do not use street drugs.  Do not share needles.  Ask your health care provider for help if you need support or information about quitting drugs.  Tell your health care provider if you often feel depressed.  Tell your health care provider if you have ever been abused or do not feel safe at home. This information is not intended to replace advice given to you by your health care provider. Make sure you discuss any questions you have with your health care provider. Document Released: 10/14/2010 Document Revised: 09/06/2015 Document Reviewed: 01/02/2015 Elsevier Interactive Patient Education  2019 [employer].

## 2018-09-03 NOTE — Progress Notes (Signed)
[facility]  Holly Todd DOB: 48 y.o. MRN: [id number]  Subjective:  Here for Biometric Screen/brief exam  She has no concerns, has seen her [facility] PCP recently and is following up with them.   Patient  denies any fever, body aches,chills, rash, chest pain, shortness of breath, nausea, vomiting, or diarrhea.   Objective: Blood pressure 118/84, pulse 64, temperature 98.5 F (36.9 C), temperature source Oral, resp. rate 16, height 5\' 3"  (1.6 m), weight 257 lb (116.6 kg), SpO2 99 %. NAD HEENT: Within normal limits Neck: Normal, neck supple  Heart: Regular rate and rhythm Lungs: Clear  Assessment: Biometric screen   Plan:  She has a primary care and will follow up with them regarding her labs. She would like a copy of her labs sent to her doctor at [facility]   Fasting glucose and lipids. Discussed with patient that today's visit here is a limited biometric screening visit (not a comprehensive exam or management of any chronic problems) Discussed some health issues, including healthy eating habits and exercise. Encouraged to follow-up with PCP for annual comprehensive preventive and wellness care (and if applicable, any chronic issues). Questions invited and answered.   Follow up with primary care as needed for chronic and maintenance health care- can be seen in this employee clinic for acute care.

## 2018-09-04 LAB — CBC WITH DIFFERENTIAL/PLATELET
Basophils Absolute: 0.1 10*3/uL (ref 0.0–0.2)
Basos: 1 %
EOS (ABSOLUTE): 0.3 10*3/uL (ref 0.0–0.4)
Eos: 5 %
Hematocrit: 40.2 % (ref 34.0–46.6)
Hemoglobin: 13.6 g/dL (ref 11.1–15.9)
Immature Grans (Abs): 0 10*3/uL (ref 0.0–0.1)
Immature Granulocytes: 0 %
Lymphocytes Absolute: 2.4 10*3/uL (ref 0.7–3.1)
Lymphs: 37 %
MCH: 30 pg (ref 26.6–33.0)
MCHC: 33.8 g/dL (ref 31.5–35.7)
MCV: 89 fL (ref 79–97)
Monocytes Absolute: 0.4 10*3/uL (ref 0.1–0.9)
Monocytes: 6 %
Neutrophils Absolute: 3.3 10*3/uL (ref 1.4–7.0)
Neutrophils: 51 %
Platelets: 307 10*3/uL (ref 150–450)
RBC: 4.53 x10E6/uL (ref 3.77–5.28)
RDW: 12.5 % (ref 11.7–15.4)
WBC: 6.4 10*3/uL (ref 3.4–10.8)

## 2018-09-04 LAB — COMPREHENSIVE METABOLIC PANEL
ALT: 19 IU/L (ref 0–32)
AST: 16 IU/L (ref 0–40)
Albumin/Globulin Ratio: 1.9 (ref 1.2–2.2)
Albumin: 4.5 g/dL (ref 3.8–4.8)
Alkaline Phosphatase: 55 IU/L (ref 39–117)
BUN/Creatinine Ratio: 17 (ref 9–23)
BUN: 17 mg/dL (ref 6–24)
Bilirubin Total: 0.4 mg/dL (ref 0.0–1.2)
CO2: 23 mmol/L (ref 20–29)
Calcium: 9.7 mg/dL (ref 8.7–10.2)
Chloride: 99 mmol/L (ref 96–106)
Creatinine, Ser: 0.98 mg/dL (ref 0.57–1.00)
GFR calc Af Amer: 79 mL/min/{1.73_m2} (ref >59)
GFR calc non Af Amer: 68 mL/min/{1.73_m2} (ref >59)
Globulin, Total: 2.4 g/dL (ref 1.5–4.5)
Glucose: 105 mg/dL — ABNORMAL HIGH (ref 65–99)
Potassium: 4.7 mmol/L (ref 3.5–5.2)
Sodium: 140 mmol/L (ref 134–144)
Total Protein: 6.9 g/dL (ref 6.0–8.5)

## 2018-09-04 LAB — LIPID PANEL WITH LDL/HDL RATIO
Cholesterol, Total: 243 mg/dL — ABNORMAL HIGH (ref 100–199)
HDL: 37 mg/dL — ABNORMAL LOW (ref >39)
LDL Calculated: 162 mg/dL — ABNORMAL HIGH (ref 0–99)
LDl/HDL Ratio: 4.4 ratio — ABNORMAL HIGH (ref 0.0–3.2)
Triglycerides: 219 mg/dL — ABNORMAL HIGH (ref 0–149)
VLDL Cholesterol Cal: 44 mg/dL — ABNORMAL HIGH (ref 5–40)

## 2018-09-04 LAB — TSH: TSH: 2.24 u[IU]/mL (ref 0.450–4.500)

## 2018-09-04 LAB — HGB A1C W/O EAG: Hgb A1c MFr Bld: 6 % — ABNORMAL HIGH (ref 4.8–5.6)

## 2019-02-11 LAB — HM PAP SMEAR

## 2019-02-11 LAB — RESULTS CONSOLE HPV: CHL HPV: NEGATIVE

## 2019-06-03 ENCOUNTER — Other Ambulatory Visit: Payer: Managed Care, Other (non HMO)

## 2019-06-06 ENCOUNTER — Other Ambulatory Visit: Payer: Managed Care, Other (non HMO)

## 2019-06-06 ENCOUNTER — Other Ambulatory Visit: Payer: Self-pay

## 2019-06-06 DIAGNOSIS — E785 Hyperlipidemia, unspecified: Secondary | ICD-10-CM | POA: Diagnosis not present

## 2019-06-06 DIAGNOSIS — I10 Essential (primary) hypertension: Secondary | ICD-10-CM | POA: Diagnosis not present

## 2019-06-07 LAB — HGB A1C W/O EAG: Hgb A1c MFr Bld: 6.1 % — ABNORMAL HIGH (ref 4.8–5.6)

## 2019-06-07 LAB — COMPREHENSIVE METABOLIC PANEL
ALT: 15 IU/L (ref 0–32)
AST: 12 IU/L (ref 0–40)
Albumin/Globulin Ratio: 1.7 (ref 1.2–2.2)
Albumin: 4.1 g/dL (ref 3.8–4.8)
Alkaline Phosphatase: 57 IU/L (ref 39–117)
BUN/Creatinine Ratio: 20 (ref 9–23)
BUN: 19 mg/dL (ref 6–24)
Bilirubin Total: 0.4 mg/dL (ref 0.0–1.2)
CO2: 26 mmol/L (ref 20–29)
Calcium: 9.3 mg/dL (ref 8.7–10.2)
Chloride: 102 mmol/L (ref 96–106)
Creatinine, Ser: 0.97 mg/dL (ref 0.57–1.00)
GFR calc Af Amer: 79 mL/min/{1.73_m2} (ref >59)
GFR calc non Af Amer: 69 mL/min/{1.73_m2} (ref >59)
Globulin, Total: 2.4 g/dL (ref 1.5–4.5)
Glucose: 110 mg/dL — ABNORMAL HIGH (ref 65–99)
Potassium: 4.5 mmol/L (ref 3.5–5.2)
Sodium: 140 mmol/L (ref 134–144)
Total Protein: 6.5 g/dL (ref 6.0–8.5)

## 2019-06-07 LAB — TSH: TSH: 1.78 u[IU]/mL (ref 0.450–4.500)

## 2019-06-07 LAB — LIPID PANEL
Chol/HDL Ratio: 6 ratio — ABNORMAL HIGH (ref 0.0–4.4)
Cholesterol, Total: 215 mg/dL — ABNORMAL HIGH (ref 100–199)
HDL: 36 mg/dL — ABNORMAL LOW (ref >39)
LDL Chol Calc (NIH): 142 mg/dL — ABNORMAL HIGH (ref 0–99)
Triglycerides: 204 mg/dL — ABNORMAL HIGH (ref 0–149)
VLDL Cholesterol Cal: 37 mg/dL (ref 5–40)

## 2019-06-07 LAB — CBC WITH DIFFERENTIAL/PLATELET
Basophils Absolute: 0 10*3/uL (ref 0.0–0.2)
Basos: 1 %
EOS (ABSOLUTE): 0.3 10*3/uL (ref 0.0–0.4)
Eos: 4 %
Hematocrit: 41.1 % (ref 34.0–46.6)
Hemoglobin: 13.7 g/dL (ref 11.1–15.9)
Immature Grans (Abs): 0 10*3/uL (ref 0.0–0.1)
Immature Granulocytes: 0 %
Lymphocytes Absolute: 2.8 10*3/uL (ref 0.7–3.1)
Lymphs: 43 %
MCH: 30 pg (ref 26.6–33.0)
MCHC: 33.3 g/dL (ref 31.5–35.7)
MCV: 90 fL (ref 79–97)
Monocytes Absolute: 0.6 10*3/uL (ref 0.1–0.9)
Monocytes: 9 %
Neutrophils Absolute: 2.9 10*3/uL (ref 1.4–7.0)
Neutrophils: 43 %
Platelets: 312 10*3/uL (ref 150–450)
RBC: 4.56 x10E6/uL (ref 3.77–5.28)
RDW: 12.2 % (ref 11.7–15.4)
WBC: 6.6 10*3/uL (ref 3.4–10.8)

## 2019-06-07 LAB — MAGNESIUM: Magnesium: 2 mg/dL (ref 1.6–2.3)

## 2019-09-05 IMAGING — MG MM DIGITAL SCREENING BILAT W/ CAD
4 series · 4 of 4 positions shown · non-contrast
Comparison: Previous exam(s).

CLINICAL DATA: Screening.

EXAM:
DIGITAL SCREENING BILATERAL MAMMOGRAM WITH CAD

[R MLO]
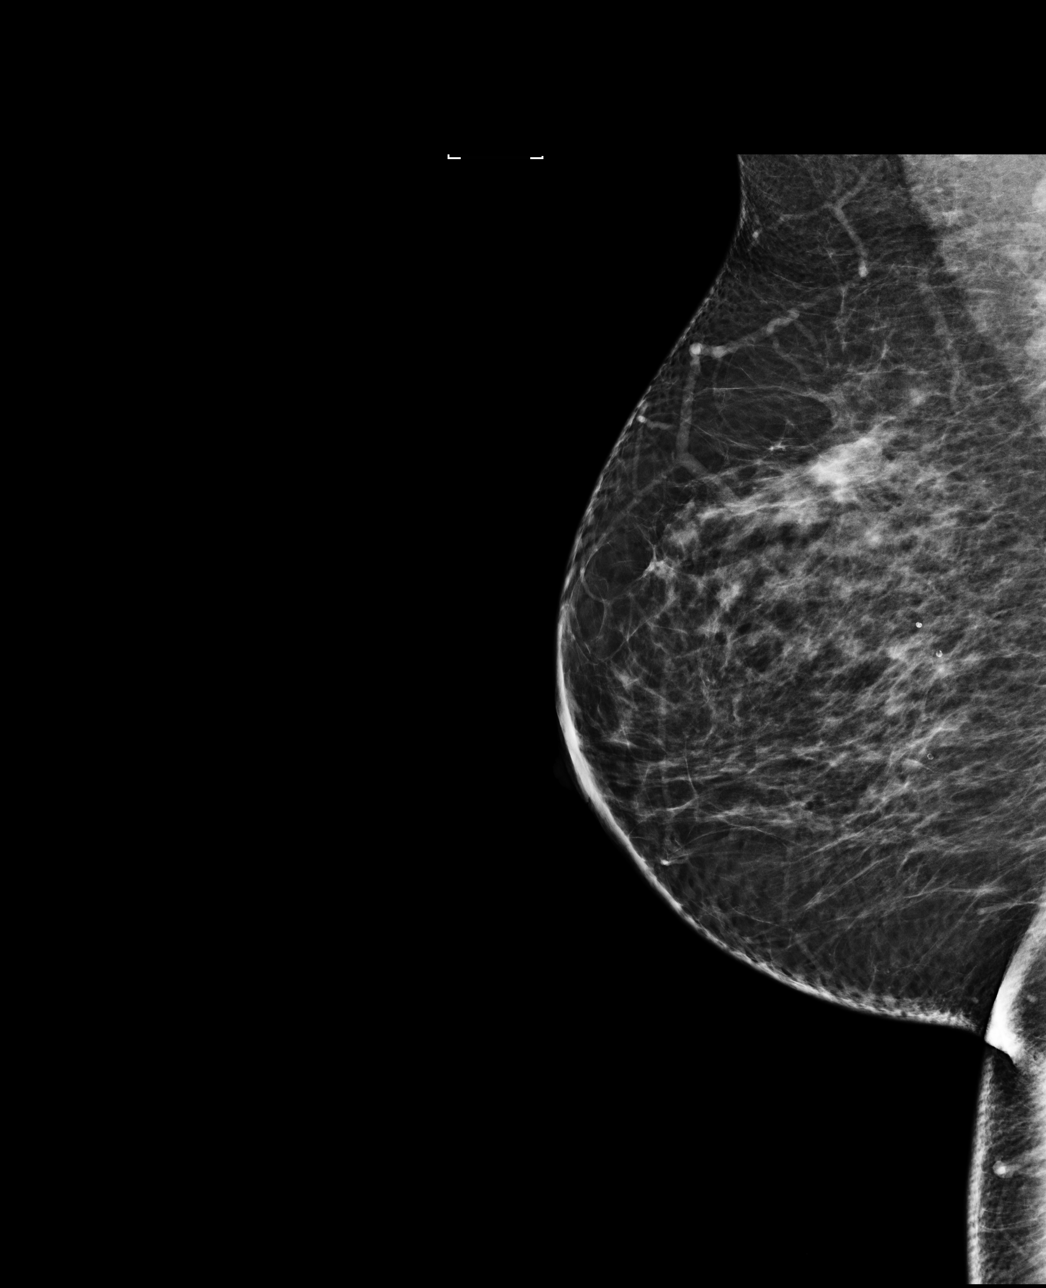

[L MLO]
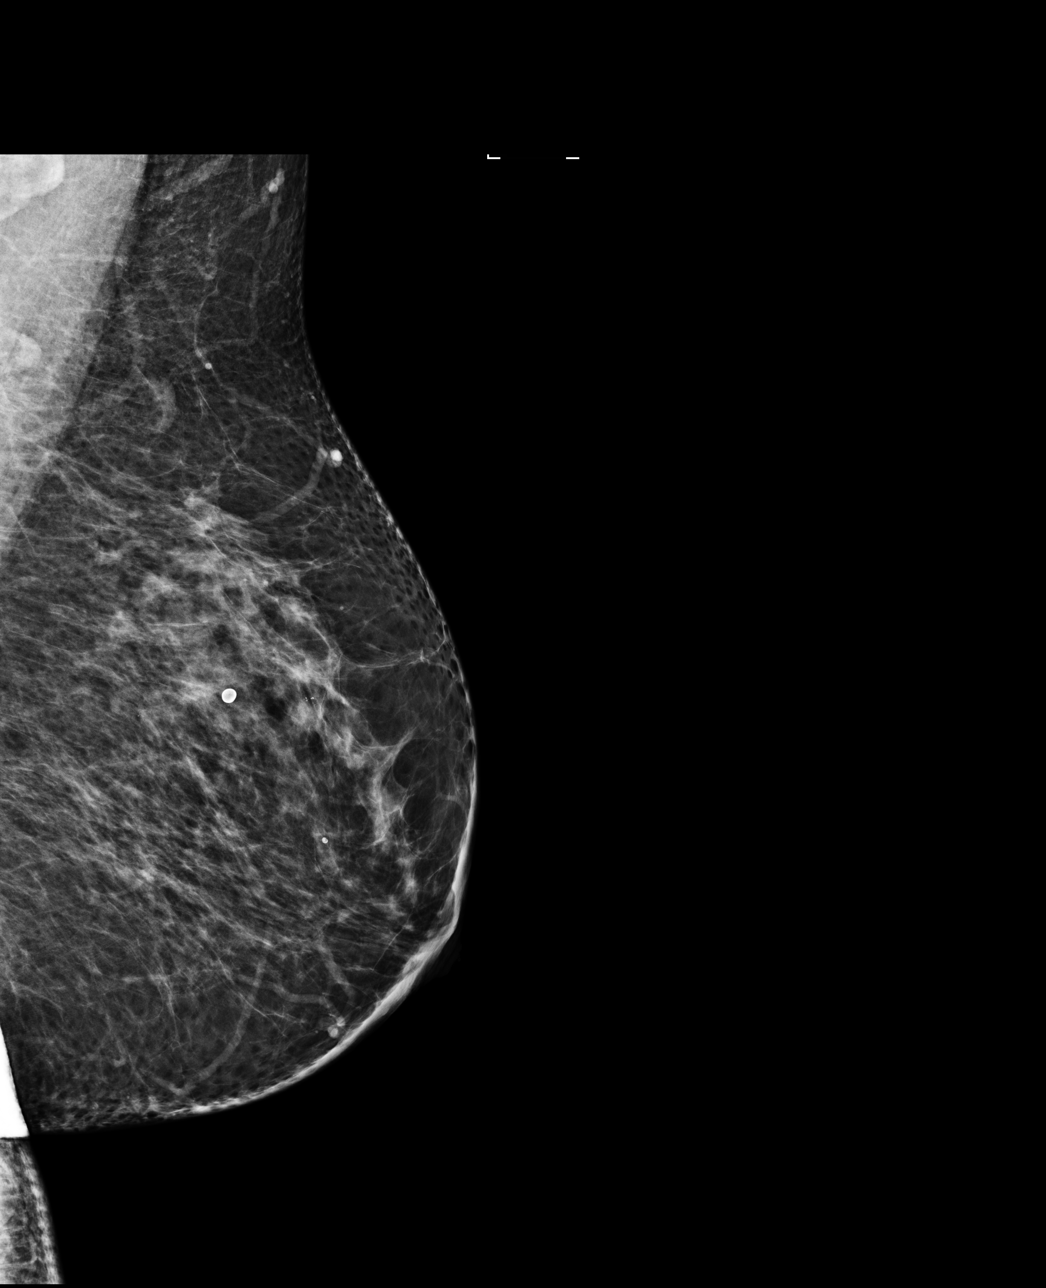

[L CC]
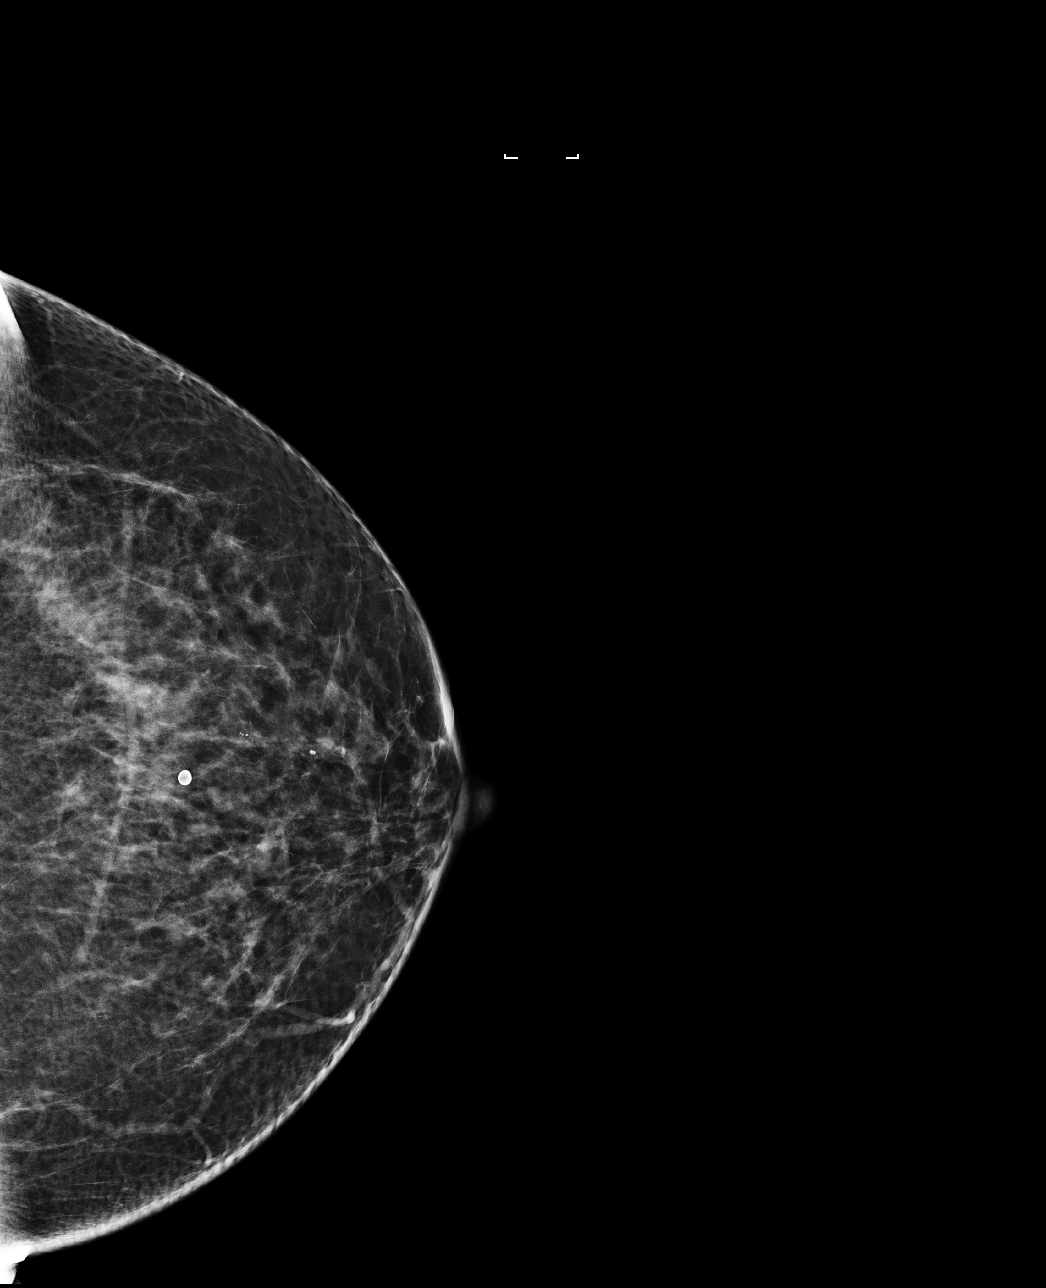

[R CC]
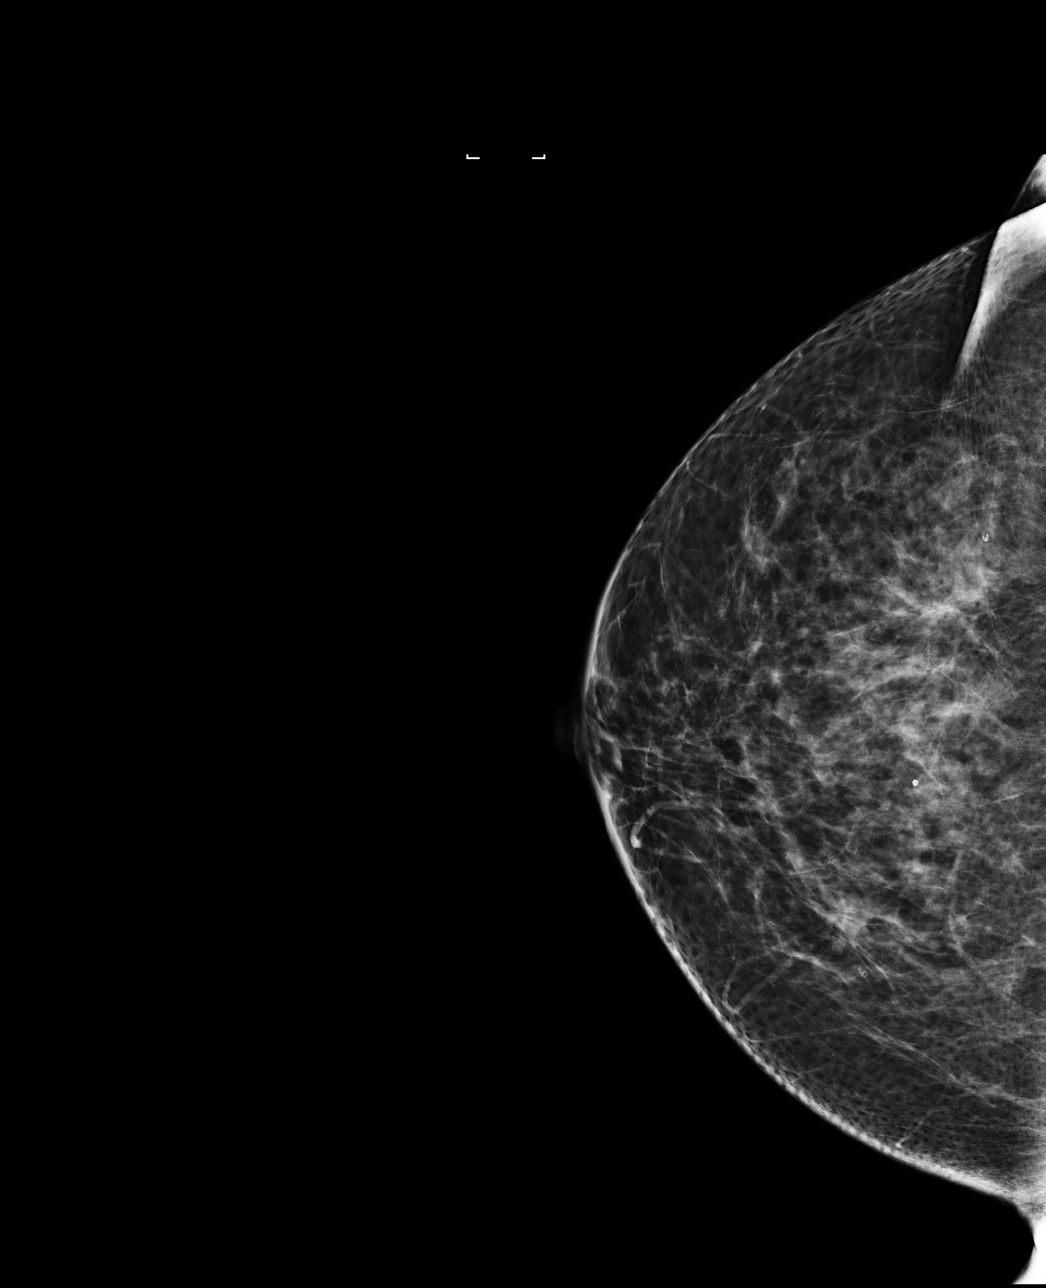

[4 of 4 positions shown; findings below may reference images not displayed]

ACR Breast Density Category b: There are scattered areas of
fibroglandular density.
FINDINGS: In the right breast, possible distortion warrants further
evaluation. This possible distortion is seen within the outer right
breast, at posterior depth, with possible corresponding asymmetry
within the upper right breast on the MLO view.

In the left breast, no findings suspicious for malignancy. Images
were processed with CAD.
IMPRESSION: Further evaluation is suggested for possible distortion in the right
breast.

RECOMMENDATION:
Diagnostic mammogram and possibly ultrasound of the right breast.
(Code:[id number])

The patient will be contacted regarding the findings, and additional
imaging will be scheduled.

BI-RADS CATEGORY  0: Incomplete. Need additional imaging evaluation
and/or prior mammograms for comparison.

## 2020-01-09 ENCOUNTER — Ambulatory Visit: Payer: Managed Care, Other (non HMO)

## 2020-01-09 ENCOUNTER — Other Ambulatory Visit: Payer: Self-pay

## 2020-01-09 VITALS — BP 116/68 | HR 62 | Temp 97.8°F | Resp 16 | Ht 63.0 in | Wt 257.0 lb

## 2020-01-09 DIAGNOSIS — E1010 Type 1 diabetes mellitus with ketoacidosis without coma: Secondary | ICD-10-CM

## 2020-01-09 DIAGNOSIS — Z008 Encounter for other general examination: Secondary | ICD-10-CM

## 2020-01-09 DIAGNOSIS — E559 Vitamin D deficiency, unspecified: Secondary | ICD-10-CM

## 2020-01-09 DIAGNOSIS — E785 Hyperlipidemia, unspecified: Secondary | ICD-10-CM

## 2020-01-09 DIAGNOSIS — R5383 Other fatigue: Secondary | ICD-10-CM

## 2020-01-09 DIAGNOSIS — R7301 Impaired fasting glucose: Secondary | ICD-10-CM

## 2020-01-10 LAB — COMPREHENSIVE METABOLIC PANEL
ALT: 18 IU/L (ref 0–32)
AST: 13 IU/L (ref 0–40)
Albumin/Globulin Ratio: 2 (ref 1.2–2.2)
Albumin: 4.2 g/dL (ref 3.8–4.8)
Alkaline Phosphatase: 63 IU/L (ref 44–121)
BUN/Creatinine Ratio: 21 (ref 9–23)
BUN: 20 mg/dL (ref 6–24)
Bilirubin Total: 0.2 mg/dL (ref 0.0–1.2)
CO2: 25 mmol/L (ref 20–29)
Calcium: 9.6 mg/dL (ref 8.7–10.2)
Chloride: 101 mmol/L (ref 96–106)
Creatinine, Ser: 0.96 mg/dL (ref 0.57–1.00)
GFR calc Af Amer: 80 mL/min/{1.73_m2} (ref >59)
GFR calc non Af Amer: 69 mL/min/{1.73_m2} (ref >59)
Globulin, Total: 2.1 g/dL (ref 1.5–4.5)
Glucose: 109 mg/dL — ABNORMAL HIGH (ref 65–99)
Potassium: 4.4 mmol/L (ref 3.5–5.2)
Sodium: 139 mmol/L (ref 134–144)
Total Protein: 6.3 g/dL (ref 6.0–8.5)

## 2020-01-10 LAB — LIPID PANEL
Chol/HDL Ratio: 6.4 ratio — ABNORMAL HIGH (ref 0.0–4.4)
Cholesterol, Total: 225 mg/dL — ABNORMAL HIGH (ref 100–199)
HDL: 35 mg/dL — ABNORMAL LOW (ref >39)
LDL Chol Calc (NIH): 151 mg/dL — ABNORMAL HIGH (ref 0–99)
Triglycerides: 215 mg/dL — ABNORMAL HIGH (ref 0–149)
VLDL Cholesterol Cal: 39 mg/dL (ref 5–40)

## 2020-01-10 LAB — HGB A1C W/O EAG: Hgb A1c MFr Bld: 6.2 % — ABNORMAL HIGH (ref 4.8–5.6)

## 2020-01-10 LAB — VITAMIN D 25 HYDROXY (VIT D DEFICIENCY, FRACTURES): Vit D, 25-Hydroxy: 24.9 ng/mL — ABNORMAL LOW (ref 30.0–100.0)

## 2020-01-10 LAB — TSH: TSH: 2.16 u[IU]/mL (ref 0.450–4.500)

## 2020-01-23 ENCOUNTER — Other Ambulatory Visit: Payer: Self-pay | Admitting: Nurse Practitioner

## 2020-01-23 DIAGNOSIS — Z1231 Encounter for screening mammogram for malignant neoplasm of breast: Secondary | ICD-10-CM

## 2021-02-04 ENCOUNTER — Other Ambulatory Visit: Payer: Self-pay | Admitting: Nurse Practitioner

## 2021-02-04 DIAGNOSIS — Z1231 Encounter for screening mammogram for malignant neoplasm of breast: Secondary | ICD-10-CM

## 2021-03-06 ENCOUNTER — Other Ambulatory Visit: Payer: Self-pay

## 2021-03-06 ENCOUNTER — Ambulatory Visit
Admission: RE | Admit: 2021-03-06 | Discharge: 2021-03-06 | Disposition: A | Discharge status: Home / Self Care | Payer: Managed Care, Other (non HMO) | Source: Ambulatory Visit | Attending: Nurse Practitioner | Admitting: Nurse Practitioner

## 2021-03-06 DIAGNOSIS — Z1231 Encounter for screening mammogram for malignant neoplasm of breast: Secondary | ICD-10-CM | POA: Insufficient documentation

## 2021-04-09 ENCOUNTER — Other Ambulatory Visit: Payer: Self-pay

## 2021-04-09 ENCOUNTER — Ambulatory Visit
Admission: EM | Admit: 2021-04-09 | Discharge: 2021-04-09 | Disposition: A | Discharge status: Home / Self Care | Payer: Managed Care, Other (non HMO)

## 2021-04-09 DIAGNOSIS — J0100 Acute maxillary sinusitis, unspecified: Secondary | ICD-10-CM | POA: Diagnosis not present

## 2021-04-09 MED ORDER — PREDNISONE 10 MG (21) PO TBPK: ORAL_TABLET | ORAL | 0 refills | Status: DC

## 2021-04-09 MED ORDER — IPRATROPIUM BROMIDE 0.06 % NA SOLN: 2.0000 | Freq: Four times a day (QID) | NASAL | 12 refills | Status: DC

## 2021-04-09 MED ORDER — BENZONATATE 100 MG PO CAPS: 200.0000 mg | ORAL_CAPSULE | Freq: Three times a day (TID) | ORAL | 0 refills | Status: DC

## 2021-04-09 MED ORDER — AMOXICILLIN-POT CLAVULANATE 875-125 MG PO TABS: 1.0000 | ORAL_TABLET | Freq: Two times a day (BID) | ORAL | 0 refills | Status: AC

## 2021-04-09 MED ORDER — PROMETHAZINE-DM 6.25-15 MG/5ML PO SYRP: 5.0000 mL | ORAL_SOLUTION | Freq: Four times a day (QID) | ORAL | 0 refills | Status: DC | PRN

## 2021-04-09 NOTE — ED Triage Notes (Signed)
Pt states she has a sinus infection going into her chest x 5 days.  Denies fevers, HA, body aches.  Does have cough and soreness in chest from cough.  Requesting abx and prednisone.

## 2021-04-09 NOTE — ED Provider Notes (Signed)
[facility]    CSN: [id number] Arrival date & time: 04/09/21  1017      History   Chief Complaint Chief Complaint  Patient presents with   Nasal Congestion    HPI Holly Todd is a 50 y.o. female.   HPI  50 year old female here for evaluation of respiratory complaints.  Patient reports that she has been experiencing nasal congestion with green nasal discharge for the last 5 days that is also associated with ear pain, sore throat, and a productive cough for yellow-brown mucus.  She denies fever, shortness breath or wheezing, GI complaints, or body aches.  No sick contacts.  Patient reports that she has a history of frequent sinus infections, though her last one was 2 years ago, and they present in the same manner.  She is here requesting antibiotics and steroids.  Past Medical History:  Diagnosis Date   Asthma    Heart murmur    Hypertension     Patient Active Problem List   Diagnosis Date Noted   Pes anserine bursitis 11/02/2017   Patellofemoral arthritis of left knee 08/04/2017   De Quervain's tenosynovitis, right 08/04/2017   Cervical radiculopathy 12/03/2016   Neck pain 12/03/2016    Past Surgical History:  Procedure Laterality Date   CESAREAN SECTION     CLOSED REDUCTION NASAL FRACTURE N/A 07/20/2015   Procedure: CLOSED REDUCTION NASAL FRACTURE;  Surgeon: Carloyn Manner, MD;  Location: [facility] ORS;  Service: ENT;  Laterality: N/A;   TUBAL LIGATION      OB History   No obstetric history on file.      Home Medications    Prior to Admission medications   Medication Sig Start Date End Date Taking? Authorizing Provider  albuterol (PROVENTIL HFA;VENTOLIN HFA) 108 (90 Base) MCG/ACT inhaler INL 2 PFS PO Q 4 TO 6 H PRF SOB OR WHZ 04/26/18  Yes [provider]  amoxicillin-clavulanate (AUGMENTIN) 875-125 MG tablet Take 1 tablet by mouth every 12 (twelve) hours for 10 days. 04/09/21 04/19/21 Yes Margarette Canada, NP  benzonatate (TESSALON) 100  MG capsule Take 2 capsules (200 mg total) by mouth every 8 (eight) hours. 04/09/21  Yes Margarette Canada, NP  BYSTOLIC 20 MG TABS TK 1 T PO QD 04/26/18  Yes [provider]  ibuprofen (ADVIL) 200 MG tablet Take 200 mg by mouth every 6 (six) hours as needed.   Yes [provider]  ipratropium (ATROVENT) 0.06 % nasal spray Place 2 sprays into both nostrils 4 (four) times daily. 04/09/21  Yes Margarette Canada, NP  lisinopril (PRINIVIL,ZESTRIL) 40 MG tablet Take 40 mg by mouth every morning.   Yes [provider]  predniSONE (STERAPRED UNI-PAK 21 TAB) 10 MG (21) TBPK tablet Take 6 tablets on day 1, 5 tablets day 2, 4 tablets day 3, 3 tablets day 4, 2 tablets day 5, 1 tablet day 6 04/09/21  Yes Margarette Canada, NP  promethazine-dextromethorphan (PROMETHAZINE-DM) 6.25-15 MG/5ML syrup Take 5 mLs by mouth 4 (four) times daily as needed. 04/09/21  Yes Margarette Canada, NP  spironolactone-hydrochlorothiazide (ALDACTAZIDE) 25-25 MG tablet Take 1 tablet by mouth daily. 02/24/18  Yes [provider]  WIXELA INHUB 100-50 MCG/DOSE AEPB INL 1 PUFF PO Q 12 HOURS. RM WELL  AFTER U 04/26/18  Yes [provider]  clindamycin (CLEOCIN) 300 MG capsule Take 1 capsule (300 mg total) by mouth 3 (three) times daily. 05/26/18   Flinchum, Kelby Aline, FNP    Family History Family History  Problem Relation  Age of Onset   Breast cancer Neg Hx     Social History Social History   Tobacco Use   Smoking status: Never   Smokeless tobacco: Never  Substance Use Topics   Alcohol use: No    Alcohol/week: 0.0 standard drinks   Drug use: No     Allergies   Norvasc [amlodipine besylate]   Review of Systems Review of Systems  Constitutional:  Negative for activity change, appetite change and fever.  HENT:  Positive for congestion, ear pain, rhinorrhea, sinus pressure, sinus pain and sore throat.   Respiratory:  Positive for cough. Negative for shortness of breath and wheezing.    Gastrointestinal:  Negative for diarrhea, nausea and vomiting.  Skin:  Negative for rash.  Hematological: Negative.   Psychiatric/Behavioral: Negative.      Physical Exam Triage Vital Signs ED Triage Vitals  Enc Vitals Group     BP 04/09/21 1220 116/68     Pulse Rate 04/09/21 1220 85     Resp 04/09/21 1220 18     Temp 04/09/21 1220 99.5 F (37.5 C)     Temp Source 04/09/21 1220 Oral     SpO2 04/09/21 1220 97 %     Weight --      Height --      Head Circumference --      Peak Flow --      Pain Score 04/09/21 1216 0     Pain Loc --      Pain Edu? --      Excl. in [place]? --    No data found.  Updated Vital Signs BP 116/68 (BP Location: Right Arm)    Pulse 85    Temp 99.5 F (37.5 C) (Oral)    Resp 18    LMP 07/18/2015    SpO2 97%   Visual Acuity Right Eye Distance:   Left Eye Distance:   Bilateral Distance:    Right Eye Near:   Left Eye Near:    Bilateral Near:     Physical Exam Vitals and nursing note reviewed.  Constitutional:      General: She is not in acute distress.    Appearance: Normal appearance. She is not ill-appearing.  HENT:     Head: Normocephalic and atraumatic.     Right Ear: Tympanic membrane, ear canal and external ear normal. There is no impacted cerumen.     Left Ear: Tympanic membrane, ear canal and external ear normal. There is no impacted cerumen.     Nose: Congestion and rhinorrhea present.     Mouth/Throat:     Mouth: Mucous membranes are moist.     Pharynx: Oropharynx is clear. Posterior oropharyngeal erythema present.  Cardiovascular:     Rate and Rhythm: Normal rate and regular rhythm.     Pulses: Normal pulses.     Heart sounds: Normal heart sounds. No murmur heard.   No gallop.  Pulmonary:     Effort: Pulmonary effort is normal.     Breath sounds: Normal breath sounds. No wheezing, rhonchi or rales.  Musculoskeletal:     Cervical back: Normal range of motion and neck supple.  Lymphadenopathy:     Cervical: No cervical  adenopathy.  Skin:    General: Skin is warm and dry.     Capillary Refill: Capillary refill takes less than 2 seconds.     Findings: No erythema or rash.  Neurological:     General: No focal deficit present.  Mental Status: She is alert and oriented to person, place, and time.  Psychiatric:        Mood and Affect: Mood normal.        Behavior: Behavior normal.        Thought Content: Thought content normal.        Judgment: Judgment normal.     UC Treatments / Results  Labs (all labs ordered are listed, but only abnormal results are displayed) Labs Reviewed - No data to display  EKG   Radiology No results found.  Procedures Procedures (including critical care time)  Medications Ordered in UC Medications - No data to display  Initial Impression / Assessment and Plan / UC Course  I have reviewed the triage vital signs and the nursing notes.  Pertinent labs & imaging results that were available during my care of the patient were reviewed by me and considered in my medical decision making (see chart for details).  Patient is a pleasant, nontoxic-appearing 50 year old female here for evaluation of respiratory and sinus complaints as outlined HPI above.  Patient's physical exam reveals pearly gray tympanic membranes bilaterally with normal light reflex and clear external auditory canals.  Nasal mucosa is erythematous and edematous with purulent discharge in both nares.  Patient does have tenderness to percussion of bilateral maxillary sinuses.  Oropharyngeal exam reveals posterior oropharyngeal erythema and yellow postnasal drip.  No cervical lymphadenopathy appreciated exam.  Cardiopulmonary exam reveals clear lung sounds in all fields.  Despite patient's short duration of symptoms I will treat her for sinus infection with Augmentin twice daily for 10 days.  Losq of Atrovent nasal spray to up the nasal congestion, Tessalon Perles and Promethazine DM cough syrup.   Final  Clinical Impressions(s) / UC Diagnoses   Final diagnoses:  Acute non-recurrent maxillary sinusitis     Discharge Instructions      The Augmentin twice daily with food for 10 days for treatment of your sinusitis.  Perform sinus irrigation 2-3 times a day with a NeilMed sinus rinse kit and distilled water.  Do not use tap water.  You can use plain over-the-counter Mucinex every 6 hours to break up the stickiness of the mucus so your body can clear it.  Increase your oral fluid intake to thin out your mucus so that is also able for your body to clear more easily.  Take an over-the-counter probiotic, such as Culturelle-align-activia, 1 hour after each dose of antibiotic to prevent diarrhea.  Use the Atrovent nasal spray, 2 squirts in each nostril every 6 hours, as needed for runny nose and postnasal drip.  Use the Tessalon Perles every 8 hours during the day.  Take them with a small sip of water.  They may give you some numbness to the base of your tongue or a metallic taste in your mouth, this is normal.  Use the Promethazine DM cough syrup at bedtime for cough and congestion.  It will make you drowsy so do not take it during the day.  Take the prednisone tomorrow morning and take it with breakfast.  If you develop any new or worsening symptoms return for reevaluation or see your primary care provider.      ED Prescriptions     Medication Sig Dispense Auth. Provider   amoxicillin-clavulanate (AUGMENTIN) 875-125 MG tablet Take 1 tablet by mouth every 12 (twelve) hours for 10 days. 20 tablet Margarette Canada, NP   ipratropium (ATROVENT) 0.06 % nasal spray Place 2 sprays into both nostrils 4 (  four) times daily. 15 mL Margarette Canada, NP   benzonatate (TESSALON) 100 MG capsule Take 2 capsules (200 mg total) by mouth every 8 (eight) hours. 21 capsule Margarette Canada, NP   promethazine-dextromethorphan (PROMETHAZINE-DM) 6.25-15 MG/5ML syrup Take 5 mLs by mouth 4 (four) times daily as needed. 118  mL Margarette Canada, NP   predniSONE (STERAPRED UNI-PAK 21 TAB) 10 MG (21) TBPK tablet Take 6 tablets on day 1, 5 tablets day 2, 4 tablets day 3, 3 tablets day 4, 2 tablets day 5, 1 tablet day 6 21 tablet Margarette Canada, NP      PDMP not reviewed this encounter.   Margarette Canada, NP 04/09/21 [phone]

## 2021-04-09 NOTE — Discharge Instructions (Signed)
The Augmentin twice daily with food for 10 days for treatment of your sinusitis.  Perform sinus irrigation 2-3 times a day with a NeilMed sinus rinse kit and distilled water.  Do not use tap water.  You can use plain over-the-counter Mucinex every 6 hours to break up the stickiness of the mucus so your body can clear it.  Increase your oral fluid intake to thin out your mucus so that is also able for your body to clear more easily.  Take an over-the-counter probiotic, such as Culturelle-align-activia, 1 hour after each dose of antibiotic to prevent diarrhea.  Use the Atrovent nasal spray, 2 squirts in each nostril every 6 hours, as needed for runny nose and postnasal drip.  Use the Tessalon Perles every 8 hours during the day.  Take them with a small sip of water.  They may give you some numbness to the base of your tongue or a metallic taste in your mouth, this is normal.  Use the Promethazine DM cough syrup at bedtime for cough and congestion.  It will make you drowsy so do not take it during the day.  Take the prednisone tomorrow morning and take it with breakfast.  If you develop any new or worsening symptoms return for reevaluation or see your primary care provider.

## 2022-05-30 ENCOUNTER — Ambulatory Visit: Payer: Managed Care, Other (non HMO) | Admitting: Nurse Practitioner

## 2022-05-30 ENCOUNTER — Other Ambulatory Visit: Payer: Self-pay | Admitting: Nurse Practitioner

## 2022-06-09 ENCOUNTER — Ambulatory Visit: Payer: Managed Care, Other (non HMO) | Admitting: Nurse Practitioner

## 2022-06-09 VITALS — BP 112/60 | HR 61 | Ht 63.0 in | Wt 246.0 lb

## 2022-06-09 DIAGNOSIS — M5412 Radiculopathy, cervical region: Secondary | ICD-10-CM | POA: Diagnosis not present

## 2022-06-09 DIAGNOSIS — I10 Essential (primary) hypertension: Secondary | ICD-10-CM | POA: Insufficient documentation

## 2022-06-09 DIAGNOSIS — E782 Mixed hyperlipidemia: Secondary | ICD-10-CM | POA: Diagnosis not present

## 2022-06-09 DIAGNOSIS — M542 Cervicalgia: Secondary | ICD-10-CM | POA: Diagnosis not present

## 2022-06-09 NOTE — Patient Instructions (Signed)
1) Cont making healthy food choices, low chol diet 2) A1c at 6.0 % improved 3) Weight loss intentional, pt congratulated 4) Pap in future

## 2022-06-09 NOTE — Progress Notes (Signed)
Established Patient Office Visit  Subjective:  Patient ID: Holly Todd, female    DOB: 06-08-1970  Age: 52 y.o. MRN: [id number]  Chief Complaint  Patient presents with   Follow-up    Follow up and lab results discussed, lipid panel elevated and patient is working on healthier food choices with less fried foods and A1c is improved at 6.0%.  Patient has lost weight.       Past Medical History:  Diagnosis Date   Asthma    Heart murmur    Hypertension     Social History   Socioeconomic History   Marital status: Married    Spouse name: Not on file   Number of children: Not on file   Years of education: Not on file   Highest education level: Not on file  Occupational History   Not on file  Tobacco Use   Smoking status: Never   Smokeless tobacco: Never  Substance and Sexual Activity   Alcohol use: No    Alcohol/week: 0.0 standard drinks of alcohol   Drug use: No   Sexual activity: Not on file  Other Topics Concern   Not on file  Social History Narrative   Not on file   Social Determinants of Health   Financial Resource Strain: Not on file  Food Insecurity: Not on file  Transportation Needs: Not on file  Physical Activity: Not on file  Stress: Not on file  Social Connections: Not on file  Intimate Partner Violence: Not on file    Family History  Problem Relation Age of Onset   Breast cancer Neg Hx     Allergies  Allergen Reactions   Norvasc [Amlodipine Besylate] Palpitations    Chest pain    Review of Systems  Constitutional: Negative.   HENT: Negative.    Eyes: Negative.   Respiratory: Negative.    Cardiovascular: Negative.   Gastrointestinal: Negative.   Genitourinary: Negative.   Musculoskeletal: Negative.   Skin: Negative.   Neurological: Negative.   Endo/Heme/Allergies: Negative.   Psychiatric/Behavioral: Negative.         Objective:   BP 112/60   Pulse 61   Ht '5\' 3"'$  (1.6 m)   Wt 246 lb (111.6 kg)   LMP 07/18/2015   SpO2  98%   BMI 43.58 kg/m   Vitals:   06/09/22 1143  BP: 112/60  Pulse: 61  Height: '5\' 3"'$  (1.6 m)  Weight: 246 lb (111.6 kg)  SpO2: 98%  BMI (Calculated): 43.59    Physical Exam Vitals reviewed.  Constitutional:      Appearance: Normal appearance.  HENT:     Head: Normocephalic.     Nose: Nose normal.     Mouth/Throat:     Mouth: Mucous membranes are dry.  Eyes:     Pupils: Pupils are equal, round, and reactive to light.  Cardiovascular:     Rate and Rhythm: Normal rate and regular rhythm.  Pulmonary:     Effort: Pulmonary effort is normal.     Breath sounds: Normal breath sounds.  Abdominal:     General: Bowel sounds are normal.     Palpations: Abdomen is soft.  Musculoskeletal:        General: Normal range of motion.  Lymphadenopathy:     Cervical: Cervical adenopathy present.  Skin:    General: Skin is warm and dry.  Neurological:     Mental Status: She is alert and oriented to person, place, and time.  Psychiatric:  Mood and Affect: Mood normal.        Behavior: Behavior normal.      No results found for any visits on 06/09/22.  No results found for this or any previous visit (from the past 2160 hour(s)).    Assessment & Plan:   Problem List Items Addressed This Visit       Cardiovascular and Mediastinum   Essential hypertension, benign   Relevant Medications   ezetimibe (ZETIA) 10 MG tablet     Nervous and Auditory   Cervical radiculopathy - Primary     Other   Neck pain   Mixed hyperlipidemia   Relevant Medications   ezetimibe (ZETIA) 10 MG tablet    Return in about 6 months (around 12/08/2022) for Needs CPE with Pap and CBE in August per patient request, does not need labs .   Total time spent: 30 minutes  Evern Bio, NP  06/09/2022

## 2022-06-17 ENCOUNTER — Other Ambulatory Visit: Payer: Self-pay | Admitting: Nurse Practitioner

## 2022-06-17 ENCOUNTER — Other Ambulatory Visit: Payer: Self-pay

## 2022-06-17 DIAGNOSIS — I5033 Acute on chronic diastolic (congestive) heart failure: Secondary | ICD-10-CM

## 2022-06-17 DIAGNOSIS — R601 Generalized edema: Secondary | ICD-10-CM

## 2022-06-17 MED ORDER — TORSEMIDE 20 MG PO TABS: 20.0000 mg | ORAL_TABLET | Freq: Every day | ORAL | 0 refills | Status: DC

## 2022-06-18 ENCOUNTER — Other Ambulatory Visit: Payer: Self-pay

## 2022-06-18 MED ORDER — ALBUTEROL SULFATE HFA 108 (90 BASE) MCG/ACT IN AERS: INHALATION_SPRAY | RESPIRATORY_TRACT | 1 refills | Status: DC

## 2022-08-11 ENCOUNTER — Other Ambulatory Visit: Payer: Self-pay | Admitting: Nurse Practitioner

## 2022-09-09 ENCOUNTER — Other Ambulatory Visit: Payer: Self-pay | Admitting: Nurse Practitioner

## 2022-11-05 ENCOUNTER — Other Ambulatory Visit: Payer: Self-pay | Admitting: Cardiovascular Disease

## 2022-11-05 ENCOUNTER — Telehealth: Payer: Self-pay | Admitting: Nurse Practitioner

## 2022-11-05 DIAGNOSIS — Z131 Encounter for screening for diabetes mellitus: Secondary | ICD-10-CM

## 2022-11-05 DIAGNOSIS — R601 Generalized edema: Secondary | ICD-10-CM

## 2022-11-05 DIAGNOSIS — I10 Essential (primary) hypertension: Secondary | ICD-10-CM

## 2022-11-05 DIAGNOSIS — Z1329 Encounter for screening for other suspected endocrine disorder: Secondary | ICD-10-CM

## 2022-11-05 DIAGNOSIS — E782 Mixed hyperlipidemia: Secondary | ICD-10-CM

## 2022-11-07 NOTE — Telephone Encounter (Signed)
Orders entered

## 2023-01-05 LAB — HEPATIC FUNCTION PANEL
ALT: 18 U/L (ref 7–35)
AST: 15 (ref 13–35)
Alkaline Phosphatase: 71 (ref 25–125)
Bilirubin, Total: 0.4

## 2023-01-05 LAB — LIPID PANEL
Cholesterol: 222 — AB (ref 0–200)
HDL: 33 — AB (ref 35–70)
LDL Cholesterol: 149
LDl/HDL Ratio: 6.7
Triglycerides: 219 — AB (ref 40–160)

## 2023-01-05 LAB — BASIC METABOLIC PANEL
BUN: 9 (ref 4–21)
CO2: 24 — AB (ref 13–22)
Chloride: 102 (ref 99–108)
Creatinine: 0.8 (ref 0.5–1.1)
Glucose: 83
Potassium: 4.6 meq/L (ref 3.5–5.1)
Sodium: 142 (ref 137–147)

## 2023-01-05 LAB — COMPREHENSIVE METABOLIC PANEL
Albumin: 4 (ref 3.5–5.0)
Calcium: 9 (ref 8.7–10.7)
Globulin: 2.2
eGFR: 88

## 2023-01-05 LAB — TSH: TSH: 2.35 (ref 0.41–5.90)

## 2023-01-05 LAB — HEMOGLOBIN A1C: Hemoglobin A1C: 6.2

## 2023-01-26 ENCOUNTER — Ambulatory Visit (INDEPENDENT_AMBULATORY_CARE_PROVIDER_SITE_OTHER): Payer: Managed Care, Other (non HMO) | Admitting: Family

## 2023-01-26 ENCOUNTER — Encounter: Payer: Self-pay | Admitting: Family

## 2023-01-26 VITALS — BP 136/86 | HR 67 | Ht 63.0 in | Wt 259.2 lb

## 2023-01-26 DIAGNOSIS — Z0000 Encounter for general adult medical examination without abnormal findings: Secondary | ICD-10-CM

## 2023-01-26 NOTE — Patient Instructions (Signed)

## 2023-01-26 NOTE — Progress Notes (Signed)
Complete physical exam  Patient: Holly Todd   DOB: 01/28/1971   52 y.o. Female  MRN: [id number]  Subjective:    Chief Complaint  Patient presents with   Annual Exam    CPE    Holly Todd is a 52 y.o. female who presents today for a complete physical exam. She reports consuming a general diet. She generally feels well. She reports sleeping well. She does have additional problems to discuss today.    Most recent fall risk assessment:     No data to display           Most recent depression screenings:    01/26/2023    6:55 PM  PHQ 2/9 Scores  PHQ - 2 Score 0  PHQ- 9 Score 0    Past Medical History:  Diagnosis Date   Asthma    Heart murmur    Hypertension     Past Surgical History:  Procedure Laterality Date   CESAREAN SECTION     CLOSED REDUCTION NASAL FRACTURE N/A 07/20/2015   Procedure: CLOSED REDUCTION NASAL FRACTURE;  Surgeon: Bud Face, MD;  Location: [facility] ORS;  Service: ENT;  Laterality: N/A;   TUBAL LIGATION      Family History  Problem Relation Age of Onset   Breast cancer Neg Hx     Social History   Socioeconomic History   Marital status: Married    Spouse name: Not on file   Number of children: Not on file   Years of education: Not on file   Highest education level: Not on file  Occupational History   Not on file  Tobacco Use   Smoking status: Never   Smokeless tobacco: Never  Substance and Sexual Activity   Alcohol use: No    Alcohol/week: 0.0 standard drinks of alcohol   Drug use: No   Sexual activity: Not on file  Other Topics Concern   Not on file  Social History Narrative   Not on file   Social Determinants of Health   Financial Resource Strain: Not on file  Food Insecurity: Not on file  Transportation Needs: Not on file  Physical Activity: Not on file  Stress: Not on file  Social Connections: Not on file  Intimate Partner Violence: Not on file    Outpatient Medications Prior to Visit  Medication  Sig   albuterol (VENTOLIN HFA) 108 (90 Base) MCG/ACT inhaler INHALE 2 PUFFS BY MOUTH EVERY 4 TO 6 HOURS AS NEEDED FOR SHORTNESS OF BREATH OR WHEEZING   fluticasone-salmeterol (ADVAIR) 100-50 MCG/ACT AEPB INHALE 1 PUFF BY MOUTH DAILY. RINSE MOUTH WELL AFTER USE   hydrochlorothiazide (HYDRODIURIL) 25 MG tablet Take 25 mg by mouth daily.   ibuprofen (ADVIL) 200 MG tablet Take 200 mg by mouth every 6 (six) hours as needed.   lisinopril (ZESTRIL) 40 MG tablet TAKE 1 TABLET BY MOUTH EVERY MORNING   Nebivolol HCl 20 MG TABS TAKE 1 TABLET BY MOUTH EVERY DAY   Nebivolol HCl 20 MG TABS TAKE 1 TABLET BY MOUTH EVERY DAY (Patient not taking: Reported on 01/26/2023)   [DISCONTINUED] benzonatate (TESSALON) 100 MG capsule Take 2 capsules (200 mg total) by mouth every 8 (eight) hours. (Patient not taking: Reported on 01/26/2023)   [DISCONTINUED] clindamycin (CLEOCIN) 300 MG capsule Take 1 capsule (300 mg total) by mouth 3 (three) times daily. (Patient not taking: Reported on 01/26/2023)   [DISCONTINUED] ezetimibe (ZETIA) 10 MG tablet SMARTSIG:1 Tablet(s) By Mouth Every Evening (Patient not taking: Reported  on 01/26/2023)   [DISCONTINUED] ipratropium (ATROVENT) 0.06 % nasal spray Place 2 sprays into both nostrils 4 (four) times daily. (Patient not taking: Reported on 01/26/2023)   [DISCONTINUED] lisinopril (ZESTRIL) 40 MG tablet Take 1 tablet by mouth daily. (Patient not taking: Reported on 01/26/2023)   [DISCONTINUED] Nebivolol HCl 20 MG TABS TAKE 1 TABLET BY MOUTH EVERY DAY (Patient not taking: Reported on 01/26/2023)   [DISCONTINUED] predniSONE (STERAPRED UNI-PAK 21 TAB) 10 MG (21) TBPK tablet Take 6 tablets on day 1, 5 tablets day 2, 4 tablets day 3, 3 tablets day 4, 2 tablets day 5, 1 tablet day 6 (Patient not taking: Reported on 01/26/2023)   [DISCONTINUED] promethazine-dextromethorphan (PROMETHAZINE-DM) 6.25-15 MG/5ML syrup Take 5 mLs by mouth 4 (four) times daily as needed. (Patient not taking: Reported on  01/26/2023)   [DISCONTINUED] spironolactone-hydrochlorothiazide (ALDACTAZIDE) 25-25 MG tablet Take 1 tablet by mouth daily. (Patient not taking: Reported on 01/26/2023)   [DISCONTINUED] torsemide (DEMADEX) 20 MG tablet Take 1 tablet (20 mg total) by mouth daily. (Patient not taking: Reported on 01/26/2023)   No facility-administered medications prior to visit.    Review of Systems  All other systems reviewed and are negative.       Objective:     BP 136/86   Pulse 67   Ht 5\' 3"  (1.6 m)   Wt 259 lb 3.2 oz (117.6 kg)   LMP 07/18/2015   SpO2 96%   BMI 45.92 kg/m   Physical Exam Vitals and nursing note reviewed.  Constitutional:      Appearance: Normal appearance. She is normal weight.  HENT:     Head: Normocephalic and atraumatic.  Eyes:     Extraocular Movements: Extraocular movements intact.     Conjunctiva/sclera: Conjunctivae normal.     Pupils: Pupils are equal, round, and reactive to light.  Cardiovascular:     Rate and Rhythm: Normal rate.  Pulmonary:     Effort: Pulmonary effort is normal.     Breath sounds: Normal breath sounds.  Neurological:     General: No focal deficit present.     Mental Status: She is alert and oriented to person, place, and time. Mental status is at baseline.  Psychiatric:        Mood and Affect: Mood normal.        Behavior: Behavior normal.        Thought Content: Thought content normal.        Judgment: Judgment normal.      Results for orders placed or performed in visit on 01/26/23  HM PAP SMEAR  Result Value Ref Range   HM Pap smear NIL   Basic metabolic panel  Result Value Ref Range   Glucose 83    BUN 9 4 - 21   CO2 24 (A) 13 - 22   Creatinine 0.8 0.5 - 1.1   Potassium 4.6 3.5 - 5.1 mEq/L   Sodium 142 137 - 147   Chloride 102 99 - 108  Comprehensive metabolic panel  Result Value Ref Range   Globulin 2.2    eGFR 88    Calcium 9.0 8.7 - 10.7   Albumin 4.0 3.5 - 5.0  Lipid panel  Result Value Ref Range    LDl/HDL Ratio 6.7    Triglycerides 219 (A) 40 - 160   Cholesterol 222 (A) 0 - 200   HDL 33 (A) 35 - 70   LDL Cholesterol 149   Hepatic function panel  Result Value Ref Range  Alkaline Phosphatase 71 25 - 125   ALT 18 7 - 35 U/L   AST 15 13 - 35   Bilirubin, Total 0.4   Hemoglobin A1c  Result Value Ref Range   Hemoglobin A1C 6.2   TSH  Result Value Ref Range   TSH 2.35 0.41 - 5.90  Results Console HPV  Result Value Ref Range   CHL HPV Negative     Recent Results (from the past 2160 hour(s))  Basic metabolic panel     Status: Abnormal   Collection Time: 01/05/23  3:30 PM  Result Value Ref Range   Glucose 83    BUN 9 4 - 21   CO2 24 (A) 13 - 22   Creatinine 0.8 0.5 - 1.1   Potassium 4.6 3.5 - 5.1 mEq/L   Sodium 142 137 - 147   Chloride 102 99 - 108  Comprehensive metabolic panel     Status: None   Collection Time: 01/05/23  3:30 PM  Result Value Ref Range   Globulin 2.2    eGFR 88    Calcium 9.0 8.7 - 10.7   Albumin 4.0 3.5 - 5.0  Lipid panel     Status: Abnormal   Collection Time: 01/05/23  3:30 PM  Result Value Ref Range   LDl/HDL Ratio 6.7    Triglycerides 219 (A) 40 - 160   Cholesterol 222 (A) 0 - 200   HDL 33 (A) 35 - 70   LDL Cholesterol 149   Hepatic function panel     Status: None   Collection Time: 01/05/23  3:30 PM  Result Value Ref Range   Alkaline Phosphatase 71 25 - 125   ALT 18 7 - 35 U/L   AST 15 13 - 35   Bilirubin, Total 0.4   Hemoglobin A1c     Status: None   Collection Time: 01/05/23  3:30 PM  Result Value Ref Range   Hemoglobin A1C 6.2   TSH     Status: None   Collection Time: 01/05/23  3:30 PM  Result Value Ref Range   TSH 2.35 0.41 - 5.90        Assessment & Plan:    Routine Health Maintenance and Physical Exam   There is no immunization history on file for this patient.  Health Maintenance  Topic Date Due   HIV Screening  Never done   Hepatitis C Screening  Never done   DTaP/Tdap/Td (1 - Tdap) Never done   Zoster  Vaccines- Shingrix (1 of 2) Never done   COVID-19 Vaccine (1 - 2023-24 season) Never done   INFLUENZA VACCINE  07/13/2023 (Originally 11/13/2022)   Colonoscopy  01/26/2024 (Originally 10/12/2014)   MAMMOGRAM  03/07/2023   Cervical Cancer Screening (HPV/Pap Cotest)  02/11/2024   HPV VACCINES  Aged Out    Discussed health benefits of physical activity, and encouraged her to engage in regular exercise appropriate for her age and condition.  Problem List Items Addressed This Visit   None Visit Diagnoses     Routine general medical examination at a health care facility    -  Primary   Labs today. Will call with results.  Patient will set up appointment as needed for any further concerns.      Return in about 1 year (around 01/26/2024) for CPE/PAP.     Miki Kins, FNP  01/26/2023   This document may have been prepared by Dragon Voice Recognition software and as such may include unintentional  dictation errors.

## 2023-02-17 ENCOUNTER — Other Ambulatory Visit: Payer: Self-pay

## 2023-02-18 ENCOUNTER — Other Ambulatory Visit: Payer: Self-pay

## 2023-02-18 MED ORDER — ALBUTEROL SULFATE HFA 108 (90 BASE) MCG/ACT IN AERS: INHALATION_SPRAY | RESPIRATORY_TRACT | 1 refills | Status: DC

## 2023-02-24 ENCOUNTER — Other Ambulatory Visit: Payer: Self-pay

## 2023-02-24 ENCOUNTER — Telehealth: Payer: Self-pay

## 2023-02-24 NOTE — Telephone Encounter (Signed)
Needs refill for inhaler from [facility] in [place].

## 2023-02-25 ENCOUNTER — Other Ambulatory Visit: Payer: Self-pay

## 2023-02-25 ENCOUNTER — Telehealth: Payer: Self-pay | Admitting: Family

## 2023-02-25 MED ORDER — ALBUTEROL SULFATE HFA 108 (90 BASE) MCG/ACT IN AERS: INHALATION_SPRAY | RESPIRATORY_TRACT | 1 refills | Status: DC

## 2023-02-25 NOTE — Telephone Encounter (Signed)
Patient left VM stating the pharmacy has sent requests for her inhaler and it was denied. Need to call back and see which inhaler she needs.

## 2023-03-10 ENCOUNTER — Ambulatory Visit: Payer: Managed Care, Other (non HMO) | Admitting: Family

## 2023-03-10 ENCOUNTER — Encounter: Payer: Self-pay | Admitting: Cardiology

## 2023-03-10 ENCOUNTER — Encounter: Payer: Self-pay | Admitting: Family

## 2023-03-10 VITALS — BP 140/80 | HR 69 | Ht 63.0 in | Wt 257.2 lb

## 2023-03-10 DIAGNOSIS — E538 Deficiency of other specified B group vitamins: Secondary | ICD-10-CM

## 2023-03-10 DIAGNOSIS — I10 Essential (primary) hypertension: Secondary | ICD-10-CM | POA: Diagnosis not present

## 2023-03-10 DIAGNOSIS — E782 Mixed hyperlipidemia: Secondary | ICD-10-CM | POA: Diagnosis not present

## 2023-03-10 DIAGNOSIS — R5383 Other fatigue: Secondary | ICD-10-CM

## 2023-03-10 DIAGNOSIS — Z79899 Other long term (current) drug therapy: Secondary | ICD-10-CM | POA: Diagnosis not present

## 2023-03-10 DIAGNOSIS — E559 Vitamin D deficiency, unspecified: Secondary | ICD-10-CM

## 2023-03-10 MED ORDER — ALBUTEROL SULFATE HFA 108 (90 BASE) MCG/ACT IN AERS: INHALATION_SPRAY | RESPIRATORY_TRACT | 1 refills | Status: DC

## 2023-03-10 MED ORDER — FLUTICASONE-SALMETEROL 100-50 MCG/ACT IN AEPB: 1.0000 | INHALATION_SPRAY | Freq: Two times a day (BID) | RESPIRATORY_TRACT | 3 refills | Status: DC

## 2023-03-10 MED ORDER — HYDROCHLOROTHIAZIDE 25 MG PO TABS: 25.0000 mg | ORAL_TABLET | Freq: Every day | ORAL | 1 refills | Status: DC

## 2023-03-10 MED ORDER — LOSARTAN POTASSIUM 50 MG PO TABS: 50.0000 mg | ORAL_TABLET | Freq: Every day | ORAL | 0 refills | Status: DC

## 2023-03-10 NOTE — Progress Notes (Deleted)
Patient taking 60 mg daily.

## 2023-03-17 ENCOUNTER — Other Ambulatory Visit: Payer: Self-pay | Admitting: Family

## 2023-03-17 MED ORDER — ALBUTEROL SULFATE HFA 108 (90 BASE) MCG/ACT IN AERS: INHALATION_SPRAY | RESPIRATORY_TRACT | 1 refills | Status: DC

## 2023-03-17 MED ORDER — FLUTICASONE-SALMETEROL 100-50 MCG/ACT IN AEPB: 1.0000 | INHALATION_SPRAY | Freq: Two times a day (BID) | RESPIRATORY_TRACT | 3 refills | Status: AC

## 2023-04-10 ENCOUNTER — Other Ambulatory Visit: Payer: Self-pay | Admitting: Family

## 2023-05-13 ENCOUNTER — Other Ambulatory Visit: Payer: Self-pay | Admitting: Family

## 2023-06-11 ENCOUNTER — Encounter: Payer: Self-pay | Admitting: Family

## 2023-06-11 NOTE — Assessment & Plan Note (Signed)
 Checking labs today.  Continue current therapy for lipid control. Will modify as needed based on labwork results.

## 2023-06-11 NOTE — Progress Notes (Signed)
 Established Patient Office Visit  Subjective:  Patient ID: Holly Todd, female    DOB: 10-Oct-1970  Age: 53 y.o. MRN: [id number]  Chief Complaint  Patient presents with   Follow-up    Discuss meds    Patient is here today for her 3 months follow up.  She has been feeling fairly well since last appointment.   She does have additional concerns to discuss today.  Labs are due today. She needs refills.   I have reviewed her active problem list, medication list, allergies, notes from last encounter, lab results for her appointment today.     No other concerns at this time.   Past Medical History:  Diagnosis Date   Asthma    Heart murmur    Hypertension     Past Surgical History:  Procedure Laterality Date   CESAREAN SECTION     CLOSED REDUCTION NASAL FRACTURE N/A 07/20/2015   Procedure: CLOSED REDUCTION NASAL FRACTURE;  Surgeon: Bud Face, MD;  Location: [facility] ORS;  Service: ENT;  Laterality: N/A;   TUBAL LIGATION      Social History   Socioeconomic History   Marital status: Married    Spouse name: Not on file   Number of children: Not on file   Years of education: Not on file   Highest education level: Not on file  Occupational History   Not on file  Tobacco Use   Smoking status: Never   Smokeless tobacco: Never  Substance and Sexual Activity   Alcohol use: No    Alcohol/week: 0.0 standard drinks of alcohol   Drug use: No   Sexual activity: Not on file  Other Topics Concern   Not on file  Social History Narrative   Not on file   Social Drivers of Health   Financial Resource Strain: Not on file  Food Insecurity: Not on file  Transportation Needs: Not on file  Physical Activity: Not on file  Stress: Not on file  Social Connections: Not on file  Intimate Partner Violence: Not on file    Family History  Problem Relation Age of Onset   Breast cancer Neg Hx     Allergies  Allergen Reactions   Norvasc [Amlodipine Besylate] Palpitations     Chest pain    Review of Systems  All other systems reviewed and are negative.      Objective:   BP (!) 140/80   Pulse 69   Ht 5\' 3"  (1.6 m)   Wt 257 lb 3.2 oz (116.7 kg)   LMP 07/18/2015   SpO2 97%   BMI 45.56 kg/m   Vitals:   03/10/23 1532  BP: (!) 140/80  Pulse: 69  Height: 5\' 3"  (1.6 m)  Weight: 257 lb 3.2 oz (116.7 kg)  SpO2: 97%  BMI (Calculated): 45.57    Physical Exam Vitals and nursing note reviewed.  Constitutional:      Appearance: Normal appearance. She is normal weight.  HENT:     Head: Normocephalic.  Eyes:     Extraocular Movements: Extraocular movements intact.     Conjunctiva/sclera: Conjunctivae normal.     Pupils: Pupils are equal, round, and reactive to light.  Cardiovascular:     Rate and Rhythm: Normal rate.  Pulmonary:     Effort: Pulmonary effort is normal.  Neurological:     General: No focal deficit present.     Mental Status: She is alert and oriented to person, place, and time. Mental status is at baseline.  Psychiatric:        Mood and Affect: Mood normal.        Behavior: Behavior normal.        Thought Content: Thought content normal.        Judgment: Judgment normal.      No results found for any visits on 03/10/23.  No results found for this or any previous visit (from the past 2160 hours).     Assessment & Plan:   Problem List Items Addressed This Visit       Cardiovascular and Mediastinum   Essential hypertension, benign   Blood pressure well controlled with current medications.  Continue current therapy.  Will reassess at follow up.          Other   Mixed hyperlipidemia   Checking labs today.  Continue current therapy for lipid control. Will modify as needed based on labwork results.       Obesity, morbid (HCC)   Continue current meds.  Will adjust as needed based on results.  The patient is asked to make an attempt to improve diet and exercise patterns to aid in medical management of this  problem. Addressed importance of increasing and maintaining water intake.        Other Visit Diagnoses       Other fatigue    -  Primary   Relevant Orders   Potassium   Iron, TIBC and Ferritin Panel     Long term current use of therapeutic drug       Checking blood work for her potassium to make sure she is not having hyperkalemia as a result of her medicaitons.  Will call with results.   Relevant Orders   Potassium     Vitamin D deficiency, unspecified       Checking labs today.  Will continue supplements as needed.   Relevant Orders   Vitamin D (25 hydroxy)     B12 deficiency due to diet       Checking labs today.  Will continue supplements as needed.   Relevant Orders   Vitamin B12       Return in about 6 months (around 09/07/2023).   Total time spent: 20 minutes  Miki Kins, FNP  03/10/2023   This document may have been prepared by [facility] Voice Recognition software and as such may include unintentional dictation errors.

## 2023-06-11 NOTE — Assessment & Plan Note (Signed)
 Continue current meds.  Will adjust as needed based on results.  The patient is asked to make an attempt to improve diet and exercise patterns to aid in medical management of this problem. Addressed importance of increasing and maintaining water intake.

## 2023-06-11 NOTE — Assessment & Plan Note (Signed)
 Blood pressure well controlled with current medications.  Continue current therapy.  Will reassess at follow up.

## 2023-06-17 ENCOUNTER — Telehealth: Payer: Self-pay | Admitting: Family

## 2023-06-17 NOTE — Telephone Encounter (Signed)
 Patient called in wanting to come pick up a printed lab requisition so she can have her labs drawn outside of our office. Please enter orders and I will print the lab req and let them know when it is ready for pick up.

## 2023-06-26 ENCOUNTER — Other Ambulatory Visit: Payer: Self-pay

## 2023-06-26 DIAGNOSIS — E538 Deficiency of other specified B group vitamins: Secondary | ICD-10-CM

## 2023-06-26 DIAGNOSIS — I10 Essential (primary) hypertension: Secondary | ICD-10-CM

## 2023-06-26 DIAGNOSIS — R5383 Other fatigue: Secondary | ICD-10-CM

## 2023-06-26 DIAGNOSIS — E559 Vitamin D deficiency, unspecified: Secondary | ICD-10-CM

## 2023-06-26 DIAGNOSIS — E782 Mixed hyperlipidemia: Secondary | ICD-10-CM

## 2023-07-25 ENCOUNTER — Other Ambulatory Visit: Payer: Self-pay | Admitting: Family

## 2023-08-07 ENCOUNTER — Encounter: Payer: Self-pay | Admitting: Family

## 2023-08-07 ENCOUNTER — Ambulatory Visit: Admitting: Family

## 2023-08-07 VITALS — BP 122/68 | HR 77 | Ht 63.0 in | Wt 263.6 lb

## 2023-08-07 DIAGNOSIS — R079 Chest pain, unspecified: Secondary | ICD-10-CM

## 2023-08-07 DIAGNOSIS — I10 Essential (primary) hypertension: Secondary | ICD-10-CM

## 2023-08-07 DIAGNOSIS — R002 Palpitations: Secondary | ICD-10-CM

## 2023-08-07 DIAGNOSIS — E782 Mixed hyperlipidemia: Secondary | ICD-10-CM

## 2023-08-07 MED ORDER — HYDRALAZINE HCL 25 MG PO TABS: 25.0000 mg | ORAL_TABLET | Freq: Two times a day (BID) | ORAL | 3 refills | Status: DC

## 2023-08-07 MED ORDER — NEXLETOL 180 MG PO TABS: 1.0000 | ORAL_TABLET | Freq: Every day | ORAL | 5 refills | Status: AC

## 2023-08-13 ENCOUNTER — Telehealth: Payer: Self-pay

## 2023-08-13 NOTE — Telephone Encounter (Signed)
 Patient called stating she took the hydralizine last night and it made her feel like she was having heart attack, so she's not taking it anymore, she said she still has some of the losartan  that she's going to keep taking in the mean time. Can you recommend something else?

## 2023-08-24 NOTE — Telephone Encounter (Signed)
 Patient called back stating that she still isn't feeling well from the new medication. Patient states she's just not feeling right.

## 2023-08-26 ENCOUNTER — Ambulatory Visit (INDEPENDENT_AMBULATORY_CARE_PROVIDER_SITE_OTHER)

## 2023-08-26 DIAGNOSIS — I340 Nonrheumatic mitral (valve) insufficiency: Secondary | ICD-10-CM | POA: Diagnosis not present

## 2023-08-26 DIAGNOSIS — I361 Nonrheumatic tricuspid (valve) insufficiency: Secondary | ICD-10-CM | POA: Diagnosis not present

## 2023-08-26 DIAGNOSIS — R002 Palpitations: Secondary | ICD-10-CM

## 2023-08-26 DIAGNOSIS — R079 Chest pain, unspecified: Secondary | ICD-10-CM

## 2023-09-17 ENCOUNTER — Other Ambulatory Visit: Payer: Self-pay

## 2023-09-21 MED ORDER — NEBIVOLOL HCL 20 MG PO TABS: 1.0000 | ORAL_TABLET | Freq: Every day | ORAL | 1 refills | Status: DC

## 2023-11-19 ENCOUNTER — Other Ambulatory Visit: Payer: Self-pay | Admitting: Family

## 2023-12-29 ENCOUNTER — Ambulatory Visit: Admitting: Family

## 2024-01-15 ENCOUNTER — Encounter: Payer: Self-pay | Admitting: Family

## 2024-01-15 ENCOUNTER — Ambulatory Visit (INDEPENDENT_AMBULATORY_CARE_PROVIDER_SITE_OTHER): Admitting: Family

## 2024-01-15 VITALS — BP 122/88 | HR 81 | Ht 63.0 in | Wt 262.6 lb

## 2024-01-15 DIAGNOSIS — I10 Essential (primary) hypertension: Secondary | ICD-10-CM

## 2024-01-15 DIAGNOSIS — J069 Acute upper respiratory infection, unspecified: Secondary | ICD-10-CM

## 2024-01-15 DIAGNOSIS — E782 Mixed hyperlipidemia: Secondary | ICD-10-CM

## 2024-01-15 DIAGNOSIS — Z124 Encounter for screening for malignant neoplasm of cervix: Secondary | ICD-10-CM

## 2024-01-15 DIAGNOSIS — Z1211 Encounter for screening for malignant neoplasm of colon: Secondary | ICD-10-CM

## 2024-01-16 ENCOUNTER — Encounter: Payer: Self-pay | Admitting: Family

## 2024-01-16 NOTE — Assessment & Plan Note (Signed)
 Blood pressure well controlled with current medications.  Continue current therapy.  Will reassess at follow up.

## 2024-01-16 NOTE — Assessment & Plan Note (Signed)
 Continue current therapy for lipid control. Will modify as needed based on labwork results.

## 2024-01-16 NOTE — Assessment & Plan Note (Signed)
 Continue current meds.  Will adjust as needed based on results.  The patient is asked to make an attempt to improve diet and exercise patterns to aid in medical management of this problem. Addressed importance of increasing and maintaining water  intake.

## 2024-01-16 NOTE — Progress Notes (Signed)
 Established Patient Office Visit  Subjective:  Patient ID: ANNALEE MEYERHOFF, female    DOB: 1971-01-03  Age: 53 y.o. MRN: [id number]  Chief Complaint  Patient presents with   Follow-up    Patient is here today for her follow up.  She has been feeling fairly well since last appointment.   She does have additional concerns to discuss today.  She needs to have a colonoscopy due to family history of GI cancers, asks if we can get the referral sent for her to go ahead and get started.  Has been having a cough, had some allergy symptoms, and the congestion has moved into her lungs.   Labs are not due today.  She needs refills.   I have reviewed her active problem list, medication list, allergies, health maintenance, notes from last encounter, lab results for her appointment today.      No other concerns at this time.   Past Medical History:  Diagnosis Date   Asthma    Heart murmur    Hypertension     Past Surgical History:  Procedure Laterality Date   CESAREAN SECTION     CLOSED REDUCTION NASAL FRACTURE N/A 07/20/2015   Procedure: CLOSED REDUCTION NASAL FRACTURE;  Surgeon: Carolee Hunter, MD;  Location: [facility] OR[facility];  Service: ENT;  Laterality: N/A;   TUBAL LIGATION      Social History   Socioeconomic History   Marital status: Married    Spouse name: Not on file   Number of children: Not on file   Years of education: Not on file   Highest education level: Not on file  Occupational History   Not on file  Tobacco Use   Smoking status: Never   Smokeless tobacco: Never  Substance and Sexual Activity   Alcohol use: No    Alcohol/week: 0.0 standard drinks of alcohol   Drug use: No   Sexual activity: Not on file  Other Topics Concern   Not on file  Social History Narrative   Not on file   Social Drivers of Health   Financial Resource Strain: Not on file  Food Insecurity: Not on file  Transportation Needs: Not on file  Physical Activity: Not on file  Stress:  Not on file  Social Connections: Not on file  Intimate Partner Violence: Not on file    Family History  Problem Relation Age of Onset   Breast cancer Neg Hx     Allergies  Allergen Reactions   Norvasc [Amlodipine Besylate] Palpitations    Chest pain    Review of Systems  Respiratory:  Positive for cough and sputum production.   All other systems reviewed and are negative.      Objective:   BP 122/88   Pulse 81   Ht 5' 3 (1.6 m)   Wt 262 lb 9.6 oz (119.1 kg)   LMP 07/18/2015   SpO2 98%   BMI 46.52 kg/m   Vitals:   01/15/24 1428  BP: 122/88  Pulse: 81  Height: 5' 3 (1.6 m)  Weight: 262 lb 9.6 oz (119.1 kg)  SpO2: 98%  BMI (Calculated): 46.53    Physical Exam Vitals and nursing note reviewed.  Constitutional:      Appearance: Normal appearance. She is normal weight.  HENT:     Head: Normocephalic.  Eyes:     Extraocular Movements: Extraocular movements intact.     Conjunctiva/sclera: Conjunctivae normal.     Pupils: Pupils are equal, round, and reactive to light.  Cardiovascular:     Rate and Rhythm: Normal rate.  Pulmonary:     Effort: Pulmonary effort is normal.     Breath sounds: Examination of the right-lower field reveals wheezing. Wheezing present.  Neurological:     General: No focal deficit present.     Mental Status: She is alert and oriented to person, place, and time. Mental status is at baseline.  Psychiatric:        Mood and Affect: Mood normal.        Behavior: Behavior normal.        Thought Content: Thought content normal.        Judgment: Judgment normal.      No results found for any visits on 01/15/24.  No results found for this or any previous visit (from the past 2160 hours).     Assessment & Plan Screening for malignant neoplasm of colon Setting patient up for referral to gastroenterology.  Will defer to them for further treatment changes.  Reassess at follow up.  Mixed hyperlipidemia Continue current therapy for  lipid control. Will modify as needed based on labwork results.   Obesity, morbid (HCC) Continue current meds.  Will adjust as needed based on results.  The patient is asked to make an attempt to improve diet and exercise patterns to aid in medical management of this problem. Addressed importance of increasing and maintaining water intake.   Essential hypertension, benign Blood pressure well controlled with current medications.  Continue current therapy.  Will reassess at follow up.   Upper respiratory tract infection, unspecified type Patient is improving.  She will let us  know if not improving by Monday, and we can send RX for prednisone  if needed.     No follow-ups on file.   Total time spent: 20 minutes  ALAN CHRISTELLA ARRANT, FNP  01/15/2024   This document may have been prepared by [facility] Voice Recognition software and as such may include unintentional dictation errors.

## 2024-03-01 ENCOUNTER — Other Ambulatory Visit: Payer: Self-pay | Admitting: Family

## 2024-04-01 ENCOUNTER — Other Ambulatory Visit: Payer: Self-pay | Admitting: Family

## 2024-04-01 MED ORDER — NEBIVOLOL HCL 20 MG PO TABS: 1.0000 | ORAL_TABLET | Freq: Every day | ORAL | 1 refills | Status: DC

## 2024-04-11 ENCOUNTER — Other Ambulatory Visit: Payer: Self-pay | Admitting: Family

## 2024-04-15 ENCOUNTER — Other Ambulatory Visit: Payer: Self-pay

## 2024-05-03 ENCOUNTER — Other Ambulatory Visit: Payer: Self-pay | Admitting: Internal Medicine

## 2024-05-16 NOTE — Assessment & Plan Note (Signed)
 Blood pressure well controlled with current medications.  Continue current therapy.  Will reassess at follow up.

## 2024-05-16 NOTE — Assessment & Plan Note (Signed)
 Continue current therapy for lipid control. Will modify as needed based on labwork results.

## 2024-05-20 ENCOUNTER — Other Ambulatory Visit: Payer: Self-pay | Admitting: Family
